# Patient Record
Sex: Female | Born: 1980 | Race: Black or African American | Hispanic: No | Marital: Single | Smoking: Former smoker
Health system: Southern US, Community
[De-identification: ages and names within clinical notes are randomized; demographics above are authoritative.]

## PROBLEM LIST (undated history)

## (undated) DIAGNOSIS — F329 Major depressive disorder, single episode, unspecified: Secondary | ICD-10-CM

## (undated) DIAGNOSIS — F32A Depression, unspecified: Secondary | ICD-10-CM

## (undated) DIAGNOSIS — I10 Essential (primary) hypertension: Secondary | ICD-10-CM

## (undated) HISTORY — DX: Essential (primary) hypertension: I10

## (undated) HISTORY — PX: APPENDECTOMY: SHX54

## (undated) HISTORY — PX: TUBAL LIGATION: SHX77

---

## 2006-07-07 ENCOUNTER — Emergency Department (HOSPITAL_COMMUNITY): Admission: EM | Admit: 2006-07-07 | Discharge: 2006-07-07 | Payer: Self-pay | Admitting: Emergency Medicine

## 2008-01-11 ENCOUNTER — Emergency Department (HOSPITAL_COMMUNITY): Admission: EM | Admit: 2008-01-11 | Discharge: 2008-01-11 | Payer: Self-pay | Admitting: Family Medicine

## 2008-05-08 ENCOUNTER — Emergency Department (HOSPITAL_COMMUNITY): Admission: EM | Admit: 2008-05-08 | Discharge: 2008-05-08 | Payer: Self-pay | Admitting: Emergency Medicine

## 2011-05-08 LAB — PREGNANCY, URINE: Preg Test, Ur: NEGATIVE

## 2014-04-24 ENCOUNTER — Encounter (HOSPITAL_COMMUNITY): Payer: Self-pay | Admitting: Emergency Medicine

## 2014-04-24 ENCOUNTER — Emergency Department (HOSPITAL_COMMUNITY)
Admission: EM | Admit: 2014-04-24 | Discharge: 2014-04-24 | Disposition: A | Discharge status: Home / Self Care | Payer: Medicaid Other | Attending: Emergency Medicine | Admitting: Emergency Medicine

## 2014-04-24 DIAGNOSIS — J029 Acute pharyngitis, unspecified: Secondary | ICD-10-CM | POA: Insufficient documentation

## 2014-04-24 DIAGNOSIS — F17200 Nicotine dependence, unspecified, uncomplicated: Secondary | ICD-10-CM | POA: Insufficient documentation

## 2014-04-24 MED ORDER — PREDNISONE 20 MG PO TABS
60.0000 mg | ORAL_TABLET | Freq: Once | ORAL | Status: AC
  Administered 2014-04-24: 60 mg via ORAL
  Filled 2014-04-24: qty 3

## 2014-04-24 MED ORDER — PENICILLIN V POTASSIUM 500 MG PO TABS: 500.0000 mg | ORAL_TABLET | Freq: Four times a day (QID) | ORAL | Status: AC

## 2014-04-24 MED ORDER — PENICILLIN V POTASSIUM 250 MG PO TABS
500.0000 mg | ORAL_TABLET | Freq: Once | ORAL | Status: AC
  Administered 2014-04-24: 500 mg via ORAL
  Filled 2014-04-24: qty 2

## 2014-04-24 MED ORDER — IBUPROFEN 800 MG PO TABS
800.0000 mg | ORAL_TABLET | Freq: Once | ORAL | Status: AC
  Administered 2014-04-24: 800 mg via ORAL
  Filled 2014-04-24: qty 1

## 2014-04-24 NOTE — ED Notes (Signed)
Pt comes to ED from home c/o generalized body aches, sore throat, ear ache, decreased appetite since Thursday. States she took theraflu, Niquel and goody powder.

## 2014-04-24 NOTE — ED Provider Notes (Signed)
CSN: [id number]     Arrival date & time 04/24/14  4098 History   First MD Initiated Contact with Patient 04/24/14 [phone]     Chief Complaint  Patient presents with  . Generalized Body Aches  . Sore Throat      Patient is a 33 y.o. female presenting with pharyngitis. The history is provided by the patient.  Sore Throat This is a new problem. The current episode started 12 to 24 hours ago. The problem occurs constantly. The problem has been gradually worsening. Associated symptoms include headaches. The symptoms are aggravated by swallowing. The symptoms are relieved by rest.  pt reports headache, ear pain, sore throat and myalgias for past 24 hours She reports only minimal cough No vomiting/diarrhea   PMH - none Past Surgical History  Procedure Laterality Date  . Tubal ligation     No family history on file. History  Substance Use Topics  . Smoking status: Current Every Day Smoker -- 2.00 packs/day    Types: Cigarettes  . Smokeless tobacco: Never Used  . Alcohol Use: Yes     Comment: daily - beer   OB History   Grav Para Term Preterm Abortions TAB SAB Ect Mult Living                 Review of Systems  Constitutional: Positive for fever and chills.  HENT: Positive for sore throat.   Respiratory: Positive for cough.   Gastrointestinal: Negative for vomiting.  Genitourinary: Negative for dysuria.  Neurological: Positive for headaches.  All other systems reviewed and are negative.     Allergies  Review of patient's allergies indicates no known allergies.  Home Medications   Prior to Admission medications   Not on File   BP 139/95  Pulse 98  Temp(Src) 100.3 F (37.9 C) (Oral)  Resp 25  Ht  (1.499 m)  Wt 109 lb (49.442 kg)  BMI 22.00 kg/m2  SpO2 99% Physical Exam CONSTITUTIONAL: Well developed/well nourished HEAD: Normocephalic/atraumatic EYES: EOMI/PERRL ENMT: Mucous membranes moist, uvula midline.  Tonsils enlarged bilaterally with exudates and  erythema noted.  No stridor.  Normal phonation Bilateral TM's obscured due to cerumen NECK: supple no meningeal signs. Cervical lymphadenopathy noted SPINE:entire spine nontender CV: S1/S2 noted, no murmurs/rubs/gallops noted LUNGS: Lungs are clear to auscultation bilaterally, no apparent distress ABDOMEN: soft, nontender, no rebound or guarding NEURO: Pt is awake/alert, moves all extremitiesx4 EXTREMITIES: pulses normal, full ROM SKIN: warm, color normal PSYCH: no abnormalities of mood noted  ED Course  Procedures   Will empirically treat for strep pharyngitis given fever/sore throat/exudates/lymphadenopathy and minimal cough  Pt is nontoxic and appropriate for outpatient management  6:36 AM Pt tolerating PO, no distress noted    MDM   Final diagnoses:  Pharyngitis    Nursing notes including past medical history and social history reviewed and considered in documentation     Joya Gaskins, MD 04/24/14 [phone]

## 2015-04-28 ENCOUNTER — Emergency Department (HOSPITAL_COMMUNITY)
Admission: EM | Admit: 2015-04-28 | Discharge: 2015-04-28 | Disposition: A | Discharge status: Home / Self Care | Payer: Medicaid Other | Attending: Emergency Medicine | Admitting: Emergency Medicine

## 2015-04-28 ENCOUNTER — Encounter (HOSPITAL_COMMUNITY): Payer: Self-pay | Admitting: *Deleted

## 2015-04-28 DIAGNOSIS — R079 Chest pain, unspecified: Secondary | ICD-10-CM | POA: Insufficient documentation

## 2015-04-28 DIAGNOSIS — Z720 Tobacco use: Secondary | ICD-10-CM | POA: Insufficient documentation

## 2015-04-28 DIAGNOSIS — H6122 Impacted cerumen, left ear: Secondary | ICD-10-CM | POA: Insufficient documentation

## 2015-04-28 DIAGNOSIS — L509 Urticaria, unspecified: Secondary | ICD-10-CM | POA: Insufficient documentation

## 2015-04-28 MED ORDER — PREDNISONE 20 MG PO TABS
60.0000 mg | ORAL_TABLET | Freq: Once | ORAL | Status: AC
  Administered 2015-04-28: 60 mg via ORAL
  Filled 2015-04-28: qty 3

## 2015-04-28 MED ORDER — FAMOTIDINE 20 MG PO TABS: 20.0000 mg | ORAL_TABLET | Freq: Two times a day (BID) | ORAL | Status: DC

## 2015-04-28 MED ORDER — DIPHENHYDRAMINE HCL 25 MG PO TABS: 25.0000 mg | ORAL_TABLET | Freq: Four times a day (QID) | ORAL | Status: DC | PRN

## 2015-04-28 NOTE — ED Notes (Signed)
Patient with allergic reaction with unknown cause. Patient reports small hives to arm and stomach intermittently. Noted swelling to right lower lip and left eye. No airway difficulty.

## 2015-04-28 NOTE — Discharge Instructions (Signed)
Hives Hives are itchy, red, swollen areas of the skin. They can vary in size and location on your body. Hives can come and go for hours or several days (acute hives) or for several weeks (chronic hives). Hives do not spread from person to person (noncontagious). They may get worse with scratching, exercise, and emotional stress. CAUSES   Allergic reaction to food, additives, or drugs.  Infections, including the common cold.  Illness, such as vasculitis, lupus, or thyroid disease.  Exposure to sunlight, heat, or cold.  Exercise.  Stress.  Contact with chemicals. SYMPTOMS   Red or white swollen patches on the skin. The patches may change size, shape, and location quickly and repeatedly.  Itching.  Swelling of the hands, feet, and face. This may occur if hives develop deeper in the skin. DIAGNOSIS  Your caregiver can usually tell what is wrong by performing a physical exam. Skin or blood tests may also be done to determine the cause of your hives. In some cases, the cause cannot be determined. TREATMENT  Mild cases usually get better with medicines such as antihistamines. Severe cases may require an emergency epinephrine injection. If the cause of your hives is known, treatment includes avoiding that trigger.  HOME CARE INSTRUCTIONS   Avoid causes that trigger your hives.  Take antihistamines as directed by your caregiver to reduce the severity of your hives. Non-sedating or low-sedating antihistamines are usually recommended. Do not drive while taking an antihistamine.  Take any other medicines prescribed for itching as directed by your caregiver.  Wear loose-fitting clothing.  Keep all follow-up appointments as directed by your caregiver. SEEK MEDICAL CARE IF:   You have persistent or severe itching that is not relieved with medicine.  You have painful or swollen joints. SEEK IMMEDIATE MEDICAL CARE IF:   You have a fever.  Your tongue or lips are swollen.  You have  trouble breathing or swallowing.  You feel tightness in the throat or chest.  You have abdominal pain. These problems may be the first sign of a life-threatening allergic reaction. Call your local emergency services (911 in [place] MAKE SURE YOU:   Understand these instructions.  Will watch your condition.  Will get help right away if you are not doing well or get worse. Document Released: 07/23/2005 Document Revised: 07/28/2013 Document Reviewed: 10/16/2011 Exit[employer] Patient Information 2015 [employer]. This information is not intended to replace advice given to you by your health care provider. Make sure you discuss any questions you have with your health care provider.  

## 2015-04-28 NOTE — ED Provider Notes (Signed)
CSN: [id number]     Arrival date & time 04/28/15  1734 History  This chart was scribed for non-physician practitioner Fayrene Helper, PA, working with Linwood Dibbles, MD, by Tanda Rockers, ED Scribe. This patient was seen in room TR07C/TR07C and the patient's care was started at 5:57 PM.  Chief Complaint  Patient presents with  . Allergic Reaction   The history is provided by the patient. No language interpreter was used.     HPI Comments: Heidi Molina is a 34 y.o. female who presents to the Emergency Department complaining of gradual onset, intermittent, diffuse itching and hives x 2-3 days. She notes intermittent lower lip swelling x 2 weeks and left eye swelling x 2 days. Pt also complains of chest pain but attributes it to her reflux. She mentions recently changing her soap. 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 She mentions that the first time she went to the patient's house she felt itchy. Pt has not taken any OTC allergy medication. Denies fever, chills, abdominal cramping, nausea, vomiting,  shortness of breath, difficulty breathing, or any other associated symptoms.    History reviewed. No pertinent past medical history. Past Surgical History  Procedure Laterality Date  . Tubal ligation    . Appendectomy     History reviewed. No pertinent family history. Social History  Substance Use Topics  . Smoking status: Current Every Day Smoker -- 2.00 packs/day    Types: Cigarettes  . Smokeless tobacco: Never Used  . Alcohol Use: Yes     Comment: daily - beer   OB History    No data available     Review of Systems  Constitutional: Negative for fever and chills.  HENT: Positive for facial swelling (Lower lip swelling. Left eye swelling. ). Negative for trouble swallowing.   Respiratory: Negative for shortness of breath.   Cardiovascular: Positive for chest pain.  Gastrointestinal: Negative for nausea, vomiting and abdominal pain.  Skin:        Hives   Allergies  Review of patient's allergies indicates no known allergies.  Home Medications   Prior to Admission medications   Not on File   Triage Vitals: BP 149/91 mmHg  Pulse 74  Temp(Src) 98 F (36.7 C)  Resp 16  SpO2 100%   Physical Exam  Constitutional: She is oriented to person, place, and time. She appears well-developed and well-nourished. No distress.  HENT:  Head: Normocephalic and atraumatic.  Cerumen impact in left ear Right TM is normal  Eyes: Conjunctivae and EOM are normal.  Neck: Neck supple. No tracheal deviation present.  Cardiovascular: Normal rate.   Pulmonary/Chest: Effort normal and breath sounds normal. No respiratory distress. She has no decreased breath sounds. She has no wheezes. She has no rhonchi. She has no rales.  Normal phonation  Musculoskeletal: Normal range of motion.  Neurological: She is alert and oriented to person, place, and time.  Skin: Skin is warm and dry. No rash noted.  hives noted to left upper eyelid hives noted to the right lower lip   Psychiatric: She has a normal mood and affect. Her behavior is normal.  Nursing note and vitals reviewed.   ED Course  Procedures (including critical care time)  DIAGNOSTIC STUDIES: Oxygen Saturation is 100% on RA, normal by my interpretation.    COORDINATION OF CARE: 6:05 PM-suspect allergy to something at her clients house.  No anaphylactic reaction noted.  Pt resting comfortably.  Discussed  treatment plan which includes Rx Pepcid and Benadryl with pt at bedside and pt agreed to plan. Stomach ache is likely 2/2 heart burn and less likely anaphylaxis.  Return precaution discussed  MDM   Final diagnoses:  Hives   BP 149/91 mmHg  Pulse 74  Temp(Src) 98 F (36.7 C)  Resp 16  SpO2 100%   I personally performed the services described in this documentation, which was scribed in my presence. The recorded information has been reviewed and is accurate.       Fayrene Helper,  PA-C 04/28/15 1808  Linwood Dibbles, MD 04/29/15 [phone]

## 2015-09-06 ENCOUNTER — Encounter (HOSPITAL_COMMUNITY): Payer: Self-pay | Admitting: Cardiology

## 2015-09-06 ENCOUNTER — Emergency Department (HOSPITAL_COMMUNITY)
Admission: EM | Admit: 2015-09-06 | Discharge: 2015-09-06 | Disposition: A | Discharge status: Home / Self Care | Payer: Medicaid Other | Attending: Emergency Medicine | Admitting: Emergency Medicine

## 2015-09-06 DIAGNOSIS — R51 Headache: Secondary | ICD-10-CM | POA: Insufficient documentation

## 2015-09-06 DIAGNOSIS — I10 Essential (primary) hypertension: Secondary | ICD-10-CM | POA: Insufficient documentation

## 2015-09-06 DIAGNOSIS — F17210 Nicotine dependence, cigarettes, uncomplicated: Secondary | ICD-10-CM | POA: Insufficient documentation

## 2015-09-06 NOTE — ED Notes (Signed)
Pt reports a headache and her BP has been elevated over the past 2 days. States she had it checked at work and it was 170s

## 2015-09-06 NOTE — ED Notes (Signed)
Patient called again in main ED waiting area with no response 

## 2015-09-06 NOTE — ED Notes (Signed)
Pt called by staff to be roomed - no response, unable to locate patient.

## 2015-09-06 NOTE — ED Notes (Signed)
Called patient without response - unable to locate patient.

## 2015-09-06 NOTE — ED Notes (Signed)
Patient called in main ED waiting area with no response 

## 2016-03-11 ENCOUNTER — Encounter (HOSPITAL_COMMUNITY): Payer: Self-pay | Admitting: Emergency Medicine

## 2016-03-11 ENCOUNTER — Emergency Department (HOSPITAL_COMMUNITY)
Admission: EM | Admit: 2016-03-11 | Discharge: 2016-03-11 | Disposition: A | Discharge status: Home / Self Care | Payer: Medicaid Other | Attending: Emergency Medicine | Admitting: Emergency Medicine

## 2016-03-11 DIAGNOSIS — Y939 Activity, unspecified: Secondary | ICD-10-CM | POA: Insufficient documentation

## 2016-03-11 DIAGNOSIS — Y929 Unspecified place or not applicable: Secondary | ICD-10-CM | POA: Insufficient documentation

## 2016-03-11 DIAGNOSIS — M7918 Myalgia, other site: Secondary | ICD-10-CM

## 2016-03-11 DIAGNOSIS — M542 Cervicalgia: Secondary | ICD-10-CM | POA: Insufficient documentation

## 2016-03-11 DIAGNOSIS — F17210 Nicotine dependence, cigarettes, uncomplicated: Secondary | ICD-10-CM | POA: Insufficient documentation

## 2016-03-11 DIAGNOSIS — Y990 Civilian activity done for income or pay: Secondary | ICD-10-CM | POA: Insufficient documentation

## 2016-03-11 DIAGNOSIS — X500XXA Overexertion from strenuous movement or load, initial encounter: Secondary | ICD-10-CM | POA: Insufficient documentation

## 2016-03-11 MED ORDER — NAPROXEN 250 MG PO TABS
500.0000 mg | ORAL_TABLET | Freq: Once | ORAL | Status: AC
  Administered 2016-03-11: 500 mg via ORAL
  Filled 2016-03-11: qty 2

## 2016-03-11 MED ORDER — NAPROXEN 500 MG PO TABS: 500.0000 mg | ORAL_TABLET | Freq: Two times a day (BID) | ORAL | 0 refills | Status: DC

## 2016-03-11 NOTE — ED Provider Notes (Signed)
[facility]-EMERGENCY DEPT Provider Note   CSN: [id number] Arrival date & time: 03/11/16  1925  First Provider Contact: First MD Initiated Contact with Patient 03/11/16 1934      By signing my name below, I, Octavia Heirrianna Nassar, attest that this documentation has been prepared under the direction and in the presence of Cheri FowlerKayla Nicholas Trompeter, PA-C.  Electronically Signed: Octavia HeirArianna Nassar, ED Scribe. 03/11/16. 7:45 PM.    History   Chief Complaint Chief Complaint  Patient presents with  . Neck Pain    The history is provided by the patient. No language interpreter was used.   HPI Comments: Jacquenette Shoneiffany S Azizi is a 35 y.o. female who presents to the Emergency Department complaining of sudden onset, gradual worsening, moderate sharp, right sided neck pain that radiates to to her right elbow onset 5 days ago. She reports associated tingling to the area and numbness to her right shoulder. 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 Pt says applying heat alleviates her pain while sleeping and certain movements increases her pain. Pt denies chest pain, shortness of breath, headache, dizziness, visual disturbance, gait abnormalities, fever, chills.  History reviewed. No pertinent past medical history.  There are no active problems to display for this patient.   Past Surgical History:  Procedure Laterality Date  . APPENDECTOMY    . TUBAL LIGATION      OB History    No data available       Home Medications    Prior to Admission medications   Medication Sig Start Date End Date Taking? Authorizing Provider  diphenhydrAMINE (BENADRYL) 25 MG tablet Take 1 tablet (25 mg total) by mouth every 6 (six) hours as needed for itching or allergies. 04/28/15   Fayrene HelperBowie Tran, PA-C  famotidine (PEPCID) 20 MG tablet Take 1 tablet (20 mg total) by mouth 2 (two) times daily. 04/28/15   Fayrene HelperBowie Tran, PA-C  naproxen (NAPROSYN) 500 MG tablet Take 1 tablet (500 mg total) by mouth 2 (two) times daily. 03/11/16   Cheri FowlerKayla Menachem Urbanek, PA-C     Family History History reviewed. No pertinent family history.  Social History Social History  Substance Use Topics  . Smoking status: Current Every Day Smoker    Packs/day: 1.00    Types: Cigarettes  . Smokeless tobacco: Never Used  . Alcohol use Yes     Comment: daily - beer     Allergies   Review of patient's allergies indicates no known allergies.   Review of Systems Review of Systems  Respiratory: Negative for shortness of breath.   Cardiovascular: Negative for chest pain.  Neurological: Negative for dizziness and headaches.     Physical Exam Updated Vital Signs BP (!) 191/103 (BP Location: Left Arm)   Pulse 65   Temp 98.1 F (36.7 C) (Oral)   Resp 20   LMP 01/19/2016   SpO2 100%   Physical Exam  Constitutional: She is oriented to person, place, and time. She appears well-developed and well-nourished.  HENT:  Head: Normocephalic and atraumatic.  Right Ear: External ear normal.  Left Ear: External ear normal.  Eyes: Conjunctivae are normal. No scleral icterus.  Neck: No tracheal deviation present.  No cervical midline tenderness, full active ROM in lateral flexion and anterior flexion Mild left trapezius tenderness Left shoulder is non-tender  Cardiovascular: Normal rate, regular rhythm and normal heart sounds.   Pulses:      Radial pulses are 2+ on the right side, and 2+ on the left side.  Pulmonary/Chest: Effort normal and breath sounds normal. No respiratory distress. She has no wheezes. She has no rales.  Abdominal: She exhibits no distension.  Musculoskeletal: Normal range of motion. She exhibits tenderness.  Neurological: She is alert and oriented to person, place, and time.  Normal strength and sensation in bilateral upper extremities  Skin: Skin is warm and dry.  Psychiatric: She has a normal mood and affect. Her behavior is normal.     ED Treatments / Results  DIAGNOSTIC STUDIES: Oxygen Saturation is 100% on RA, normal by my  interpretation.  COORDINATION OF CARE:  7:41 PM Discussed treatment plan which includes naproxen and applying heat with pt at bedside and pt agreed to plan.  Labs (all labs ordered are listed, but only abnormal results are displayed) Labs Reviewed - No data to display  EKG  EKG Interpretation None       Radiology No results found.  Procedures Procedures (including critical care time)  Medications Ordered in ED Medications  naproxen (NAPROSYN) tablet 500 mg (not administered)     Initial Impression / Assessment and Plan / ED Course  I have reviewed the triage vital signs and the nursing notes.  Pertinent labs & imaging results that were available during my care of the patient were reviewed by me and considered in my medical decision making (see chart for details).  Clinical Course   Patient presents with musculoskeletal strain/neck pain.  VSS, NAD.  No bony tenderness.  No cervical midline tenderness.  Good pulses.  Neurovascularly intact.  FAROM of cervical spine.  No neurologic symptoms, low suspicion for carotid dissection.  No signs of infection.  Recommend continued heat and will add Naproxen.  Return precautions discussed.  Patient agrees and acknowledges the above plan for discharge.   Final Clinical Impressions(s) / ED Diagnoses   Final diagnoses:  Neck pain  Musculoskeletal pain    I personally performed the services described in this documentation, which was scribed in my presence. The recorded information has been reviewed and is accurate.  New Prescriptions New Prescriptions   NAPROXEN (NAPROSYN) 500 MG TABLET    Take 1 tablet (500 mg total) by mouth 2 (two) times daily.     Cheri Fowler, PA-C 03/11/16 1948    Geoffery Lyons, MD 03/12/16 [phone]

## 2016-03-11 NOTE — ED Triage Notes (Signed)
Pt presents to ED for assessment of neck pain that began 5 days ago.  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  Pt sts she was sitting on the front porch on her day off and began to experience pain.  Pt sts some numbness to her right shoulder.  Denies difficulty walking, denies blurred vision.

## 2016-06-15 ENCOUNTER — Emergency Department (HOSPITAL_COMMUNITY)
Admission: EM | Admit: 2016-06-15 | Discharge: 2016-06-16 | Disposition: A | Discharge status: Home / Self Care | Payer: Medicaid Other | Attending: Emergency Medicine | Admitting: Emergency Medicine

## 2016-06-15 ENCOUNTER — Encounter (HOSPITAL_COMMUNITY): Payer: Self-pay | Admitting: Emergency Medicine

## 2016-06-15 DIAGNOSIS — F17210 Nicotine dependence, cigarettes, uncomplicated: Secondary | ICD-10-CM | POA: Insufficient documentation

## 2016-06-15 DIAGNOSIS — R21 Rash and other nonspecific skin eruption: Secondary | ICD-10-CM

## 2016-06-15 NOTE — ED Triage Notes (Signed)
Pt c/o ringworm on back, face "for a while," c/o itching. Denies pain. Tried OTC lotions and soaps with no relief. 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

## 2016-06-16 MED ORDER — TERBINAFINE HCL 1 % EX CREA
1.0000 | TOPICAL_CREAM | Freq: Two times a day (BID) | CUTANEOUS | 0 refills | Status: DC
Start: 2016-06-16 — End: 2017-01-28

## 2016-06-16 NOTE — ED Provider Notes (Signed)
[facility]-EM[facility]PT Provider Note   CSN: [id number] Arrival date & time: 06/15/16  2327     History   Chief Complaint Chief Complaint  Patient presents with  . Rash    HPI Heidi Molina is a 35 y.o. female with no major medial problems presents to the Emergency Department complaining of gradual, persistent, progressively worsening itchy rash onset 3 weeks ago. Patient states the rash began with one single patch to her left posterior leg and has since had multiple patches arise over her entire body. She reports they are annular with raised edges. She states significant itching.  She has been using iodine and apple cider vinegar with some decreasing itching, but no resolution.  Pt with associated scaling of the rash. 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 She states she is concerned that this is ringworm and that it came from this particular house.  Pt denies fever, chills, headache, neck pain, chest pain, SOB, abd pain, N/V/D, weakness, dizziness, syncope.      The history is provided by the patient and medical records. No language interpreter was used.    History reviewed. No pertinent past medical history.  There are no active problems to display for this patient.   Past Surgical History:  Procedure Laterality Date  . APPENDECTOMY    . TUBAL LIGATION      OB History    No data available       Home Medications    Prior to Admission medications   Medication Sig Start Date End Date Taking? Authorizing Provider  diphenhydrAMINE (BENADRYL) 25 MG tablet Take 1 tablet (25 mg total) by mouth every 6 (six) hours as needed for itching or allergies. 04/28/15   Fayrene HelperBowie Tran, PA-C  famotidine (PEPCID) 20 MG tablet Take 1 tablet (20 mg total) by mouth 2 (two) times daily. 04/28/15   Fayrene HelperBowie Tran, PA-C  naproxen (NAPROSYN) 500 MG tablet Take 1 tablet  (500 mg total) by mouth 2 (two) times daily. 03/11/16   Cheri FowlerKayla Rose, PA-C  terbinafine (LAMISIL) 1 % cream Apply 1 application topically 2 (two) times daily. 06/16/16   Montrey Buist, PA-C    Family History No family history on file.  Social History Social History  Substance Use Topics  . Smoking status: Current Every Day Smoker    Packs/day: 1.00    Types: Cigarettes  . Smokeless tobacco: Never Used  . Alcohol use Yes     Comment: daily - beer     Allergies   Patient has no known allergies.   Review of Systems Review of Systems  Skin: Positive for rash.  All other systems reviewed and are negative.    Physical Exam Updated Vital Signs BP 162/95   Pulse 84   Temp 98.8 F (37.1 C) (Oral)   Resp 16   Ht 5' (1.524 m)   Wt 58.1 kg   LMP 05/20/2016 (Within Days)   SpO2 98%   BMI 25.00 kg/m   Physical Exam  Constitutional: She is oriented to person, place, and time. She appears well-developed and well-nourished. No distress.  HENT:  Head: Normocephalic and atraumatic.  Right Ear: Tympanic membrane, external ear and ear canal normal.  Left Ear: Tympanic membrane, external ear and ear canal normal.  Nose: Nose normal. No mucosal edema or rhinorrhea.  Mouth/Throat: Uvula is midline. No uvula swelling. No oropharyngeal  exudate, posterior oropharyngeal edema, posterior oropharyngeal erythema or tonsillar abscesses.  No swelling of the uvula or oropharynx  Eyes: Conjunctivae are normal.  Neck: Normal range of motion.  Patent airway No stridor; normal phonation Handling secretions without difficulty  Cardiovascular: Normal rate, normal heart sounds and intact distal pulses.   No murmur heard. Pulmonary/Chest: Effort normal and breath sounds normal. No stridor. No respiratory distress. She has no wheezes.  No wheezes or rhonchi  Abdominal: Soft. Bowel sounds are normal. There is no tenderness.  Musculoskeletal: Normal range of motion. She exhibits no edema.    Neurological: She is alert and oriented to person, place, and time.  Skin: Skin is warm and dry. Rash noted. She is not diaphoretic.  No urticaria noted Mild excoriations - no induration or fluctuance to indicate secondary infection Multiple annular erythematous and scaling lesions on the legs, arms, trunk and face.  No evidence of secondary infection.  Psychiatric: She has a normal mood and affect.  Nursing note and vitals reviewed.    ED Treatments / Results   Procedures Procedures (including critical care time)  Medications Ordered in ED Medications - No data to display   Initial Impression / Assessment and Plan / ED Course  I have reviewed the triage vital signs and the nursing notes.  Pertinent labs & imaging results that were available during my care of the patient were reviewed by me and considered in my medical decision making (see chart for details).  Clinical Course     She presents with 3 weeks of a rash. Rash is clinically most consistent with pityriasis rosea however herald patch did not begin on the trunk.  Patient has been in contact with several animals including dogs. She reports she wears long sleeves and long pain at while in contact with these animals.  Tinea corporis versus pityriasis rosea.  Will give antifungal and have patient follow-up with primary care. No evidence of secondary infection. Discussed home care of the rash. Patient reports understanding and is in agreement with the plan for follow-up.   Final Clinical Impressions(s) / ED Diagnoses   Final diagnoses:  Rash and nonspecific skin eruption    New Prescriptions Discharge Medication List as of 06/16/2016  1:20 AM    START taking these medications   Details  terbinafine (LAMISIL) 1 % cream Apply 1 application topically 2 (two) times daily., Starting Sat 06/16/2016, [employer]         Joseph Johns, PA-C 06/16/16 [id number]    Loren Raceravid Yelverton, MD 06/16/16 [phone]

## 2016-06-16 NOTE — Discharge Instructions (Signed)
1. Medications: Use cream as directed, usual home medications 2. Treatment: rest, drink plenty of fluids, keep area clean with warm soap and water 3. Follow Up: Please followup with your primary doctor in 2-3 days and dermatology as soon as possible for discussion of your diagnoses and further evaluation after today's visit; if you do not have a primary care doctor use the resource guide provided to find one; Please return to the ER for signs of allergic reaction

## 2016-06-18 ENCOUNTER — Encounter (HOSPITAL_COMMUNITY): Payer: Self-pay | Admitting: Emergency Medicine

## 2016-06-18 ENCOUNTER — Ambulatory Visit (HOSPITAL_COMMUNITY)
Admission: EM | Admit: 2016-06-18 | Discharge: 2016-06-18 | Disposition: A | Discharge status: Home / Self Care | Payer: Medicaid Other | Attending: Emergency Medicine | Admitting: Emergency Medicine

## 2016-06-18 DIAGNOSIS — R21 Rash and other nonspecific skin eruption: Secondary | ICD-10-CM

## 2016-06-18 MED ORDER — MUPIROCIN 2 % EX OINT: 1.0000 "application " | TOPICAL_OINTMENT | Freq: Two times a day (BID) | CUTANEOUS | 0 refills | Status: DC

## 2016-06-18 NOTE — Discharge Instructions (Signed)
I'm not sure what the rash is. I put a referral in to dermatology. Stop the clotrimazole cream. Use the triamcinolone and mupirocin twice a day. Follow-up with dermatology.

## 2016-06-18 NOTE — ED Triage Notes (Signed)
Seen at ed on Friday.  Patient has areas that she thinks is possibly ring worm on legs.   Patient says ed said pityriasis.  Patient is unsure.  Patient has rash to face and trunk, arms, buttocks and legs.

## 2016-06-18 NOTE — ED Provider Notes (Signed)
[facility]    CSN: [id number] Arrival date & time: 06/18/16  1520     History   Chief Complaint Chief Complaint  Patient presents with  . Rash    HPI Heidi Molina is a 35 y.o. female.   HPI Patient is a 35 y.o. female presents to UC with gradually worsening itchy rash which began about 3 weeks ago.  States that it began with one spot on the back of left leg and has been having new lesions appearing slowly all over the body including arms, buttocks, legs, abdomen, back, face, and neck.  States that the lesions tend to scale over and peel. Pt went to the ED on 11/10 and it was thought that the lesions were tinea corporis versus pityriasis rosea. Pt was given Terbinafine 1% which she has not yet filled. Pt has been using Triamcinolone, Flucinonide, and clotrimazole which are a combination of her fiance and son's prescriptions that she had at home. She has had no relief with these.  Has also been using apple cider vinegar and iodine which she thinks has helped to clear some of the lesions. Denies anything like this in the past, any new lotions or detergents.  No bug bites that she knows of. Denies hx of eczema although son has eczema. Denies any fever, chills, abdominal pain, N/V/D.     History reviewed. No pertinent past medical history.  There are no active problems to display for this patient.   Past Surgical History:  Procedure Laterality Date  . APPENDECTOMY    . TUBAL LIGATION      OB History    No data available       Home Medications    Prior to Admission medications   Medication Sig Start Date End Date Taking? Authorizing Provider  mupirocin ointment (BACTROBAN) 2 % Apply 1 application topically 2 (two) times daily. 06/18/16   Charm RingsErin J Lesette Frary, MD  terbinafine (LAMISIL) 1 % cream Apply 1 application topically 2 (two) times daily. Patient taking differently: Apply 1 application topically 2 (two) times daily. DID NOT GET FILLED 06/16/16   Dahlia ClientHannah Muthersbaugh,  PA-C    Family History No family history on file.  Social History Social History  Substance Use Topics  . Smoking status: Current Every Day Smoker    Packs/day: 1.00    Types: Cigarettes  . Smokeless tobacco: Never Used  . Alcohol use Yes     Comment: daily - beer     Allergies   Patient has no known allergies.   Review of Systems Review of Systems  Constitutional: Negative for activity change, chills, fatigue and fever.  Respiratory: Negative for chest tightness and shortness of breath.   Cardiovascular: Negative for chest pain.  Gastrointestinal: Negative for abdominal pain, blood in stool, diarrhea, nausea and vomiting.  Genitourinary: Negative for dysuria.  Musculoskeletal: Negative for arthralgias.     Physical Exam Triage Vital Signs ED Triage Vitals [06/18/16 1651]  Enc Vitals Group     BP 138/93     Pulse Rate 71     Resp 16     Temp 98.1 F (36.7 C)     Temp Source Oral     SpO2 100 %     Weight      Height      Head Circumference      Peak Flow      Pain Score 1     Pain Loc      Pain Edu?  Excl. in GC?    No data found.   Updated Vital Signs BP 138/93 (BP Location: Left Arm)   Pulse 71   Temp 98.1 F (36.7 C) (Oral)   Resp 16   LMP 05/20/2016 (Within Days)   SpO2 100%    Physical Exam  Constitutional: She is oriented to person, place, and time. She appears well-developed and well-nourished. No distress.  HENT:  Head: Normocephalic and atraumatic.  Mouth/Throat: Uvula is midline and oropharynx is clear and moist. No uvula swelling.  Eyes: EOM are normal.  Neck: Normal range of motion and phonation normal.  Cardiovascular: Normal rate and regular rhythm.   Pulmonary/Chest: Effort normal and breath sounds normal. No stridor. No respiratory distress. She has no decreased breath sounds. She has no wheezes.  Musculoskeletal: Normal range of motion.  Neurological: She is alert and oriented to person, place, and time.  Skin: Skin is  warm and dry. Rash noted. Rash is not pustular, not vesicular and not urticarial. She is not diaphoretic.  Multiple annular erythematous and scaling lesions on the legs, arms, trunk, neck, and face. They appear to be in different phases of healing. Largest lesion is about 3 cm side to side x 2 cm up and down and is the one on the posterior left thigh which pt states was the first to present 3 weeks ago. Lesions are not grouped together, no pattern to the locations. No evidence of secondary infection. No urticaria present.   Psychiatric: She has a normal mood and affect. Her behavior is normal. Judgment and thought content normal.  Nursing note and vitals reviewed.    UC Treatments / Results  Labs (all labs ordered are listed, but only abnormal results are displayed) Labs Reviewed - No data to display  EKG  EKG Interpretation None       Radiology No results found.  Procedures Procedures (including critical care time)  Medications Ordered in UC Medications - No data to display   Initial Impression / Assessment and Plan / UC Course  I have reviewed the triage vital signs and the nursing notes.  Pertinent labs & imaging results that were available during my care of the patient were reviewed by me and considered in my medical decision making (see chart for details).  Clinical Course    Patient is a 35 y.o. female presents with annular erythematous and scaling rash present on torso, buttocks, legs, arms, face, neck which began with harold patch on posterior left thigh 3 weeks ago.  Not sure what the rash is  Mupirocin ointment 2% bid prescribed at today's visit.    Patient has been referred to dermatology.  Advised to stop the Clotrimazole cream and to use the Triamcinolone and Mupirocin twice daily.  Encouraged to follow up with dermatology.   Patient seen and examined with PA student.  Note reviewed and edited as needed.  Final Clinical Impressions(s) / UC Diagnoses    Final diagnoses:  Rash    New Prescriptions Discharge Medication List as of 06/18/2016  5:49 PM    START taking these medications   Details  mupirocin ointment (BACTROBAN) 2 % Apply 1 application topically 2 (two) times daily., Starting Mon 06/18/2016, Normal         Charm RingsErin J Chriss Redel, MD 06/18/16 2126

## 2017-01-25 ENCOUNTER — Emergency Department (HOSPITAL_COMMUNITY)
Admission: EM | Admit: 2017-01-25 | Discharge: 2017-01-25 | Disposition: A | Discharge status: Other Acute Inpatient Hospital | Payer: Medicaid Other | Attending: Emergency Medicine | Admitting: Emergency Medicine

## 2017-01-25 ENCOUNTER — Encounter (HOSPITAL_COMMUNITY): Payer: Self-pay | Admitting: *Deleted

## 2017-01-25 ENCOUNTER — Inpatient Hospital Stay (HOSPITAL_COMMUNITY)
Admission: AD | Admit: 2017-01-25 | Discharge: 2017-01-28 | DRG: 885 | Disposition: A | Discharge status: Home / Self Care | Payer: Medicaid Other | Source: Intra-hospital | Attending: Psychiatry | Admitting: Psychiatry

## 2017-01-25 DIAGNOSIS — F322 Major depressive disorder, single episode, severe without psychotic features: Secondary | ICD-10-CM | POA: Diagnosis present

## 2017-01-25 DIAGNOSIS — R45851 Suicidal ideations: Secondary | ICD-10-CM | POA: Diagnosis present

## 2017-01-25 DIAGNOSIS — F1990 Other psychoactive substance use, unspecified, uncomplicated: Secondary | ICD-10-CM

## 2017-01-25 DIAGNOSIS — F17210 Nicotine dependence, cigarettes, uncomplicated: Secondary | ICD-10-CM | POA: Diagnosis present

## 2017-01-25 DIAGNOSIS — F321 Major depressive disorder, single episode, moderate: Secondary | ICD-10-CM | POA: Diagnosis present

## 2017-01-25 DIAGNOSIS — F329 Major depressive disorder, single episode, unspecified: Secondary | ICD-10-CM | POA: Insufficient documentation

## 2017-01-25 DIAGNOSIS — F32A Depression, unspecified: Secondary | ICD-10-CM

## 2017-01-25 HISTORY — DX: Depression, unspecified: F32.A

## 2017-01-25 HISTORY — DX: Major depressive disorder, single episode, unspecified: F32.9

## 2017-01-25 HISTORY — DX: Major depressive disorder, single episode, moderate: F32.1

## 2017-01-25 LAB — COMPREHENSIVE METABOLIC PANEL
ALBUMIN: 4.5 g/dL (ref 3.5–5.0)
ALK PHOS: 64 U/L (ref 38–126)
ALT: 17 U/L (ref 14–54)
ANION GAP: 10 (ref 5–15)
AST: 18 U/L (ref 15–41)
BILIRUBIN TOTAL: 0.3 mg/dL (ref 0.3–1.2)
BUN: 6 mg/dL (ref 6–20)
CALCIUM: 9.4 mg/dL (ref 8.9–10.3)
CO2: 22 mmol/L (ref 22–32)
Chloride: 107 mmol/L (ref 101–111)
Creatinine, Ser: 0.7 mg/dL (ref 0.44–1.00)
GFR calc non Af Amer: >60 mL/min (ref >60)
GLUCOSE: 89 mg/dL (ref 65–99)
POTASSIUM: 3.9 mmol/L (ref 3.5–5.1)
SODIUM: 139 mmol/L (ref 135–145)
TOTAL PROTEIN: 8 g/dL (ref 6.5–8.1)

## 2017-01-25 LAB — CBC
HCT: 40.9 % (ref 36.0–46.0)
Hemoglobin: 13.7 g/dL (ref 12.0–15.0)
MCH: 30 pg (ref 26.0–34.0)
MCHC: 33.5 g/dL (ref 30.0–36.0)
MCV: 89.7 fL (ref 78.0–100.0)
Platelets: 435 10*3/uL — ABNORMAL HIGH (ref 150–400)
RBC: 4.56 MIL/uL (ref 3.87–5.11)
RDW: 12.8 % (ref 11.5–15.5)
WBC: 6.3 10*3/uL (ref 4.0–10.5)

## 2017-01-25 LAB — ETHANOL: Alcohol, Ethyl (B): 126 mg/dL — ABNORMAL HIGH (ref <5)

## 2017-01-25 LAB — SALICYLATE LEVEL

## 2017-01-25 LAB — RAPID URINE DRUG SCREEN, HOSP PERFORMED
Amphetamines: NOT DETECTED
BENZODIAZEPINES: NOT DETECTED
Barbiturates: NOT DETECTED
COCAINE: NOT DETECTED
OPIATES: NOT DETECTED
Tetrahydrocannabinol: POSITIVE — AB

## 2017-01-25 LAB — ACETAMINOPHEN LEVEL

## 2017-01-25 LAB — I-STAT BETA HCG BLOOD, ED (MC, WL, AP ONLY): I-stat hCG, quantitative: <5 m[IU]/mL (ref <5)

## 2017-01-25 MED ORDER — ALUM & MAG HYDROXIDE-SIMETH 200-200-20 MG/5ML PO SUSP: 30.0000 mL | ORAL | Status: DC | PRN

## 2017-01-25 MED ORDER — TRAZODONE HCL 50 MG PO TABS
50.0000 mg | ORAL_TABLET | Freq: Every evening | ORAL | Status: DC | PRN
  Administered 2017-01-27: 50 mg via ORAL
  Filled 2017-01-25 (×2): qty 7
  Filled 2017-01-25: qty 1

## 2017-01-25 MED ORDER — ACETAMINOPHEN 325 MG PO TABS
650.0000 mg | ORAL_TABLET | Freq: Four times a day (QID) | ORAL | Status: DC | PRN
  Administered 2017-01-25 – 2017-01-27 (×2): 650 mg via ORAL
  Filled 2017-01-25 (×2): qty 2

## 2017-01-25 MED ORDER — HYDROXYZINE HCL 25 MG PO TABS
25.0000 mg | ORAL_TABLET | Freq: Three times a day (TID) | ORAL | Status: DC | PRN
  Filled 2017-01-25: qty 10

## 2017-01-25 MED ORDER — MAGNESIUM HYDROXIDE 400 MG/5ML PO SUSP: 30.0000 mL | Freq: Every day | ORAL | Status: DC | PRN

## 2017-01-25 MED ORDER — IBUPROFEN 400 MG PO TABS
400.0000 mg | ORAL_TABLET | Freq: Once | ORAL | Status: AC
  Administered 2017-01-25: 400 mg via ORAL
  Filled 2017-01-25: qty 1

## 2017-01-25 NOTE — ED Triage Notes (Signed)
Pt states she feels like killing herself.  No specific plan.  Pt tearful in triage.  Has not been taking meds because she can't afford them.

## 2017-01-25 NOTE — ED Notes (Signed)
Regular Diet ordered for patient for dinner.

## 2017-01-25 NOTE — BH Assessment (Signed)
Tele Assessment Note   Heidi Molina is an 36 y.o. female presenting to the ED after an attempt to stab her wrist and jump out of a moving car. The patient reports worsening depression over the last several months. The patient has a history of depression and suicide attempt. States she has multiped stressors including her job, family conflict, and continued grief over the loss of her mother 4 yrs ago.  The patient reports increased drinking over the last month and daily cannabis use for years. Admits to mood lability, tearful at times and at other times angry. The patient denies HI or A/V.   The patient lives with her boyfriend, Doreene Adas, her two children and a nephew. 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 Patient had depressed mood and affect, freedom of movement. Reports severe anxiety, had coherent thought, impaired judgment and insight.   Hillery Jacks DNP recommends inpatient treatment  Diagnosis: MDD, recurrent severe, without psychotic features  Past Medical History:  Past Medical History:  Diagnosis Date  . Depression     Past Surgical History:  Procedure Laterality Date  . APPENDECTOMY    . TUBAL LIGATION      Family History: No family history on file.  Social History:  reports that she has been smoking Cigarettes.  She has been smoking about 1.00 pack per day. She has never used smokeless tobacco. She reports that she drinks alcohol. She reports that she uses drugs, including Marijuana.  Additional Social History:  Alcohol / Drug Use Pain Medications: see MAR Prescriptions: see MAR Over the Counter: see MAR History of alcohol / drug use?: Yes Substance #1 Name of Substance 1: alcohol 1 - Age of First Use: uknown  1 - Amount (size/oz): coors light, 3-6 beers 1 - Frequency: daily 1 - Duration: months 1 - Last Use / Amount: today Substance #2 Name of Substance 2: cannabis 2 - Age of First Use: 15 2 - Amount (size/oz): 3 blunts 2 - Frequency: daily 2 - Duration:  years 2 - Last Use / Amount: today  CIWA: CIWA-Ar BP: (!) 144/100 Pulse Rate: 88 COWS:    PATIENT STRENGTHS: (choose at least two)   Allergies: No Known Allergies  Home Medications:  (Not in a hospital admission)  OB/GYN Status:  Patient's last menstrual period was 12/04/2016.  General Assessment Data Location of Assessment: [facility] ED TTS Assessment: In system Is this a Tele or Face-to-Face Assessment?: Tele Assessment Is this an Initial Assessment or a Re-assessment for this encounter?: Initial Assessment Marital status: Long term relationship Is patient pregnant?: No Pregnancy Status: No Living Arrangements: Spouse/significant other, Children, Other relatives Can pt return to current living arrangement?: Yes Admission Status: Voluntary Is patient capable of signing voluntary admission?: Yes Referral Source: Self/Family/Friend Insurance type: MCD  Medical Screening Exam [facility] Walk-in ONLY) Medical Exam completed: Yes  Crisis Care Plan Living Arrangements: Spouse/significant other, Children, Other relatives Name of Psychiatrist: n/a Name of Therapist: n/a  Education Status Is patient currently in school?: Yes Highest grade of school patient has completed: GED, Some college   Risk to self with the past 6 months Suicidal Ideation: Yes-Currently Present Has patient been a risk to self within the past 6 months prior to admission? : Yes Suicidal Intent: Yes-Currently Present Has patient had any suicidal intent within the past 6 months prior to admission? : Yes Is patient at risk for suicide?: Yes Suicidal Plan?: Yes-Currently Present Has patient had any suicidal plan within the past 6 months prior  to admission? : Yes Specify Current Suicidal Plan:  (jump out of car, cut wrist) Access to Means: Yes Specify Access to Suicidal Means: acces to sharp objects, access to a car What has been your use of drugs/alcohol within the last 12 months?: cannabis, alcohol Previous  Attempts/Gestures: Yes How many times?:  (multiple) Triggers for Past Attempts: Unknown Intentional Self Injurious Behavior: None Family Suicide History: Unknown Recent stressful life event(s): Conflict (Comment), Loss (Comment), Financial Problems Persecutory voices/beliefs?: No Depression: Yes Depression Symptoms: Tearfulness, Feeling angry/irritable Substance abuse history and/or treatment for substance abuse?: Yes Suicide prevention information given to non-admitted patients: Not applicable  Risk to Others within the past 6 months Homicidal Ideation: No Does patient have any lifetime risk of violence toward others beyond the six months prior to admission? : No Thoughts of Harm to Others: No Current Homicidal Intent: No Current Homicidal Plan: No Access to Homicidal Means: No History of harm to others?: No Assessment of Violence: None Noted Does patient have access to weapons?: No Criminal Charges Pending?: No Does patient have a court date: No Is patient on probation?: No  Psychosis Hallucinations: None noted Delusions: None noted  Mental Status Report Appearance/Hygiene: Unremarkable Eye Contact: Good Motor Activity: Freedom of movement Speech: Logical/coherent Level of Consciousness: Alert Mood: Depressed Affect: Appropriate to circumstance Anxiety Level: Severe Thought Processes: Coherent, Relevant Judgement: Impaired Orientation: Person, Place, Time, Situation Obsessive Compulsive Thoughts/Behaviors: None  Cognitive Functioning Concentration: Normal Memory: Recent Intact, Remote Intact IQ: Average Insight: Poor Impulse Control: Poor Appetite: Fair Weight Loss: 0 Weight Gain: 0 Sleep: Decreased Total Hours of Sleep: 6 Vegetative Symptoms: None  ADLScreening [facility]) Patient's cognitive ability adequate to safely complete daily activities?: Yes Patient able to express need for assistance with ADLs?: Yes Independently performs ADLs?:  Yes (appropriate for developmental age)  Prior Inpatient Therapy Prior Inpatient Therapy: No  Prior Outpatient Therapy Prior Outpatient Therapy: No Does patient have an ACCT team?: No Does patient have Intensive In-House Services?  : No Does patient have [facility] services? : No Does patient have P4CC services?: No  ADL Screening (condition at time of admission) Patient's cognitive ability adequate to safely complete daily activities?: Yes Is the patient deaf or have difficulty hearing?: No Does the patient have difficulty seeing, even when wearing glasses/contacts?: No Does the patient have difficulty concentrating, remembering, or making decisions?: No Patient able to express need for assistance with ADLs?: Yes Does the patient have difficulty dressing or bathing?: No Independently performs ADLs?: Yes (appropriate for developmental age)       Abuse/Neglect Assessment (Assessment to be complete while patient is alone) Physical Abuse: Denies Verbal Abuse: Denies Sexual Abuse: Denies     Merchant navy officerAdvance Directives (For Healthcare) Does Patient Have a Medical Advance Directive?: No    Additional Information 1:1 In Past 12 Months?: No CIRT Risk: No Elopement Risk: No Does patient have medical clearance?: Yes     Disposition:  Disposition Initial Assessment Completed for this Encounter: Yes Disposition of Patient: Inpatient treatment program Type of inpatient treatment program: Adult  Westley Hummershley H Kamelia Lampkins 01/25/2017 6:56 PM

## 2017-01-25 NOTE — ED Provider Notes (Signed)
[facility]   CSN: [id number] Arrival date & time: 01/25/17  1241     History   Chief Complaint Chief Complaint  Patient presents with  . Suicidal    HPI Heidi Molina is a 36 y.o. female.CC: Worsening depression, suicidal, "I tried to jump out of the car on the way over here".  HPI :  This is a 36 year old female. She states that she has been depressed for several months. States this is not worsening. States that she was brought in by her boyfriend today. She states that "just life" has been making her symptoms worse. She feels depressed. She is concerned she may hurt herself. She states that his difficulty with "my job, my bills, my department, my family, and my whole life". Denies any overdose or attempt at self-harm.  She states she felt like jumping out of the car and opened the door. Her boyfriend intervened. Past Medical History:  Diagnosis Date  . Depression     There are no active problems to display for this patient.   Past Surgical History:  Procedure Laterality Date  . APPENDECTOMY    . TUBAL LIGATION      OB History    No data available       Home Medications    Prior to Admission medications   Medication Sig Start Date End Date Taking? Authorizing Provider  mupirocin ointment (BACTROBAN) 2 % Apply 1 application topically 2 (two) times daily. 06/18/16   Charm RingsHonig, Erin J, MD  terbinafine (LAMISIL) 1 % cream Apply 1 application topically 2 (two) times daily. Patient taking differently: Apply 1 application topically 2 (two) times daily. DID NOT GET FILLED 06/16/16   Muthersbaugh, Dahlia ClientHannah, PA-C    Family History No family history on file.  Social History Social History  Substance Use Topics  . Smoking status: Current Every Day Smoker    Packs/day: 1.00    Types: Cigarettes  . Smokeless tobacco: Never Used  . Alcohol use Yes     Comment: daily - 8 beers per day     Allergies   Patient has no known allergies.   Review of  Systems Review of Systems  Constitutional: Negative for appetite change, chills, diaphoresis, fatigue and fever.  HENT: Negative for mouth sores, sore throat and trouble swallowing.   Eyes: Negative for visual disturbance.  Respiratory: Negative for cough, chest tightness, shortness of breath and wheezing.   Cardiovascular: Negative for chest pain.  Gastrointestinal: Negative for abdominal distention, abdominal pain, diarrhea, nausea and vomiting.  Endocrine: Negative for polydipsia, polyphagia and polyuria.  Genitourinary: Negative for dysuria, frequency and hematuria.  Musculoskeletal: Negative for gait problem.  Skin: Negative for color change, pallor and rash.  Neurological: Negative for dizziness, syncope, light-headedness and headaches.  Hematological: Does not bruise/bleed easily.  Psychiatric/Behavioral: Positive for dysphoric mood and suicidal ideas. Negative for behavioral problems and confusion.     Physical Exam Updated Vital Signs BP (!) 144/100   Pulse 88   Temp 98.2 F (36.8 C) (Oral)   Resp 18   Ht 5' (1.524 m)   Wt 59 kg (130 lb)   LMP 12/04/2016   SpO2 100%   BMI 25.39 kg/m   Physical Exam  Constitutional: She is oriented to person, place, and time. She appears well-developed and well-nourished. No distress.  HENT:  Head: Normocephalic.  Eyes: Conjunctivae are normal. Pupils are equal, round, and reactive to light. No scleral icterus.  Neck: Normal range of motion. Neck supple. No  thyromegaly present.  Cardiovascular: Normal rate and regular rhythm.  Exam reveals no gallop and no friction rub.   No murmur heard. Pulmonary/Chest: Effort normal and breath sounds normal. No respiratory distress. She has no wheezes. She has no rales.  Abdominal: Soft. Bowel sounds are normal. She exhibits no distension. There is no tenderness. There is no rebound.  Musculoskeletal: Normal range of motion.  Neurological: She is alert and oriented to person, place, and time.    Skin: Skin is warm and dry. No rash noted.  Psychiatric: She has a normal mood and affect. Her behavior is normal.     ED Treatments / Results  Labs (all labs ordered are listed, but only abnormal results are displayed) Labs Reviewed  ETHANOL - Abnormal; Notable for the following:       Result Value   Alcohol, Ethyl (B) 126 (*)    All other components within normal limits  ACETAMINOPHEN LEVEL - Abnormal; Notable for the following:    Acetaminophen (Tylenol), Serum <10 (*)    All other components within normal limits  CBC - Abnormal; Notable for the following:    Platelets 435 (*)    All other components within normal limits  RAPID URINE DRUG SCREEN, HOSP PERFORMED - Abnormal; Notable for the following:    Tetrahydrocannabinol POSITIVE (*)    All other components within normal limits  COMPREHENSIVE METABOLIC PANEL  SALICYLATE LEVEL  I-STAT BETA HCG BLOOD, ED ([facility], [facility], [facility] ONLY)    EKG  EKG Interpretation None       Radiology No results found.  Procedures Procedures (including critical care time)  Medications Ordered in ED Medications  ibuprofen (ADVIL,MOTRIN) tablet 400 mg (400 mg Oral Given 01/25/17 1711)     Initial Impression / Assessment and Plan / ED Course  I have reviewed the triage vital signs and the nursing notes.  Pertinent labs & imaging results that were available during my care of the patient were reviewed by me and considered in my medical decision making (see chart for details).    Patient otherwise healthy. Medically clear for exam and laboratories. Admits to [facility] and this is positive on her screen. Alcohol elevated. Appropriate for her health evaluation at this time.  Final Clinical Impressions(s) / ED Diagnoses   Final diagnoses:  Depression, unspecified depression type  Suicidal ideation    New Prescriptions New Prescriptions   No medications on file     Rolland Porter, MD 01/25/17 1729

## 2017-01-25 NOTE — ED Notes (Signed)
Pt requested lemon cookies and ginger ale for snack. Pt given the same.

## 2017-01-25 NOTE — ED Notes (Signed)
Patient made 1 phone call. 

## 2017-01-25 NOTE — ED Notes (Signed)
Pt made 1 phone call. 

## 2017-01-25 NOTE — BH Assessment (Signed)
Heidi Jacksanika Lewis DNP recommends inpatient treatment.   [facility]  Patient assigned to room 304-2 Accepting physician is Dr. Jama Flavorsobos  Call report to [phone] Patient can come after 9pm Spoke with Kendal HymenBonnie the patients nurse

## 2017-01-25 NOTE — ED Notes (Signed)
[facility]. Pt and her belongings/paperwork sent with [facility] associate.

## 2017-01-25 NOTE — ED Notes (Signed)
Patient fiance called , while patient was getting TTS, so after it was finish, patient was told that Fiance said call him, Patient did not call him back when I told her.

## 2017-01-26 ENCOUNTER — Encounter (HOSPITAL_COMMUNITY): Payer: Self-pay

## 2017-01-26 DIAGNOSIS — R45851 Suicidal ideations: Secondary | ICD-10-CM

## 2017-01-26 DIAGNOSIS — F322 Major depressive disorder, single episode, severe without psychotic features: Principal | ICD-10-CM

## 2017-01-26 DIAGNOSIS — F1990 Other psychoactive substance use, unspecified, uncomplicated: Secondary | ICD-10-CM

## 2017-01-26 LAB — LIPID PANEL
Cholesterol: 169 mg/dL (ref 0–200)
HDL: 66 mg/dL (ref >40)
LDL CALC: 82 mg/dL (ref 0–99)
Total CHOL/HDL Ratio: 2.6 RATIO
Triglycerides: 105 mg/dL (ref <150)
VLDL: 21 mg/dL (ref 0–40)

## 2017-01-26 LAB — TSH: TSH: 0.764 u[IU]/mL (ref 0.350–4.500)

## 2017-01-26 MED ORDER — ONDANSETRON 4 MG PO TBDP: 4.0000 mg | ORAL_TABLET | Freq: Four times a day (QID) | ORAL | Status: DC | PRN

## 2017-01-26 MED ORDER — NALTREXONE HCL 50 MG PO TABS
50.0000 mg | ORAL_TABLET | Freq: Every day | ORAL | Status: DC
  Administered 2017-01-26 – 2017-01-28 (×3): 50 mg via ORAL
  Filled 2017-01-26 (×4): qty 1

## 2017-01-26 MED ORDER — LOPERAMIDE HCL 2 MG PO CAPS: 2.0000 mg | ORAL_CAPSULE | ORAL | Status: DC | PRN

## 2017-01-26 MED ORDER — ADULT MULTIVITAMIN W/MINERALS CH
1.0000 | ORAL_TABLET | Freq: Every day | ORAL | Status: DC
  Administered 2017-01-26 – 2017-01-28 (×3): 1 via ORAL
  Filled 2017-01-26 (×4): qty 1

## 2017-01-26 MED ORDER — NICOTINE POLACRILEX 2 MG MT GUM
2.0000 mg | CHEWING_GUM | OROMUCOSAL | Status: DC | PRN
Start: 2017-01-26 — End: 2017-01-28

## 2017-01-26 MED ORDER — LORAZEPAM 1 MG PO TABS
ORAL_TABLET | ORAL | Status: AC
  Filled 2017-01-26: qty 1

## 2017-01-26 MED ORDER — LORAZEPAM 1 MG PO TABS
1.0000 mg | ORAL_TABLET | Freq: Once | ORAL | Status: AC
  Administered 2017-01-26: 1 mg via ORAL

## 2017-01-26 MED ORDER — SERTRALINE HCL 25 MG PO TABS
25.0000 mg | ORAL_TABLET | Freq: Every day | ORAL | Status: DC
  Administered 2017-01-26 – 2017-01-28 (×3): 25 mg via ORAL
  Filled 2017-01-26 (×4): qty 1

## 2017-01-26 MED ORDER — VITAMIN B-1 100 MG PO TABS
100.0000 mg | ORAL_TABLET | Freq: Every day | ORAL | Status: DC
  Administered 2017-01-27 – 2017-01-28 (×2): 100 mg via ORAL
  Filled 2017-01-26 (×4): qty 1

## 2017-01-26 MED ORDER — LORAZEPAM 1 MG PO TABS
1.0000 mg | ORAL_TABLET | Freq: Four times a day (QID) | ORAL | Status: DC | PRN
  Administered 2017-01-27: 1 mg via ORAL
  Filled 2017-01-26: qty 1

## 2017-01-26 NOTE — Tx Team (Signed)
Initial Treatment Plan 01/26/2017 12:48 AM Salome Holmesiffany Widmann ZOX:[id number]    PATIENT STRESSORS: Financial difficulties Occupational concerns Substance abuse   PATIENT STRENGTHS: Wellsite geologistCommunication skills General fund of knowledge Motivation for treatment/growth Supportive family/friends   PATIENT IDENTIFIED PROBLEMS: Depression  Suicidal ideation  "I want help with my depression"  "I would like help with getting a medical doctor in the the community to help with my hypertension"               DISCHARGE CRITERIA:  Improved stabilization in mood, thinking, and/or behavior Motivation to continue treatment in a less acute level of care Verbal commitment to aftercare and medication compliance  PRELIMINARY DISCHARGE PLAN: Outpatient therapy Medication management  PATIENT/FAMILY INVOLVEMENT: This treatment plan has been presented to and reviewed with the patient, Heidi Molina.  The patient and family have been given the opportunity to ask questions and make suggestions.  Levin BaconHeather V Breindy Meadow, RN 01/26/2017, 12:48 AM

## 2017-01-26 NOTE — [facility] Group Notes (Signed)
[facility] Group Notes:  (Nursing/MHT/Case Management/Adjunct)  Date:  01/26/2017  Time:  3:00 PM  Type of Therapy:  Nurse Education  Participation Level:  Active  Participation Quality:  Appropriate and Attentive  Affect:  Appropriate  Cognitive:  Alert and Appropriate  Insight:  Appropriate  Engagement in Group:  Engaged  Modes of Intervention:  Activity, Discussion and Education  Summary of Progress/Problems: Today's group focused on different perspectives. Patient's were asked to identify an irrational thought. Pictures with different illusions were then passed around and patient's were asked to reveal what they saw.  A conversation was had about how in every situation there are many ways to look at the same problem and how we always have a choice to see things in a different perspective.  At the end of the group, patient's were asked to repeat their irrational thought and replace it with a different positive statement.  Heidi Molina attended and actively participated in group and offered support to peers.  [name] P 01/26/2017, 3:00 PM  

## 2017-01-26 NOTE — [facility] Group Notes (Signed)
[facility] LCSW Group Therapy Note  01/26/2017  and  10:15 AM  Type of Therapy and Topic:  Group Therapy: Avoiding Self-Sabotaging and Enabling Behaviors  Participation Level:  Active  Participation Quality:  Appropriate  Affect:  Appropriate  Cognitive:  Alert and Oriented  Insight:  Limited  Engagement in Therapy:  Improving   Therapeutic models used: Cognitive Behavioral Therapy,  Person-Centered Therapy and Motivational Interviewing  Modes of Intervention:  Discussion, Exploration, Orientation, Rapport Building, Socialization and Support   Summary of Progress/Problems:  The main focus of today's process group was for the patient to identify ways in which they have in the past sabotaged their own recovery. Motivational Interviewing was utilized to identify motivation they may have for wanting to change. The Stages of Change were explained using a handout, and patients identified where they currently are with regard to stages of change. Patient shared how she often uses alcohol to medicate when feeling overwhelmed often resulting in feeling worse. Patient states she is in contemplation stage of change.    Carney Bernatherine C Harrill, LCSW
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
## 2017-01-26 NOTE — Progress Notes (Signed)
Nursing Note: 0700-1900  D:  Pt presents with depressed mood and anxious affect stating that her life has become overwhelming and "I drink about 6-7 beers a day to put me to sleep."  Also reports lability of mood "Sometimes I cry and then other times I get mad and throw things.  Pt starting Zoloft and Depade today.  BP elevated 166/105 and pulse: 70, MD aware- will continue to monitor.  Pt completed Suicide Safety Plan, copy placed in chart (original given back to the patient.)  Pt listed the following as her current coping skills: " Listen to music, drink beer, smoke pot, go to sleep, sit outside and isolate myself, play on my phone, talk to my daughter or boyfriend, watch tv and smoke cigarettes."  A:  Encouraged to verbalize needs and concerns, active listening and support provided.  Continued Q 15 minute safety checks.    R:  Pt. is calm and cooperative. She shares that she wants to live for her kids, "I don't want to hurt myself knowing that my 2 kids will never see me again."  Denies A/V hallucinations and is able to verbally contract for safety.
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
## 2017-01-26 NOTE — Progress Notes (Signed)
Patient did attend the evening speaker AA meeting.  

## 2017-01-26 NOTE — [facility] Suicide Risk Assessment (Signed)
[facility]   Nursing information obtained from:  Patient Demographic factors:  NA Current Mental Status:  NA Loss Factors:  Financial problems / change in socioeconomic status Historical Factors:  NA Risk Reduction Factors:  Living with another person, especially a relative  Total Time spent with patient: 45 minutes Principal Problem: <principal problem not specified> Diagnosis:   Patient Active Problem List   Diagnosis Date Noted  . 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 Background history of Alcohol use Disorder and Mood disorder. Brought to the ER by her boyfriend. Reports worsening depression and thoughts of suicide. Attempted to stab her wrist and attempted to jump off the vehicle. Reports a lot of stressors "my job,,,, bills ,,, family conflict". Has been drinking regularly. Ruminating about her losses. UDS was positive for THC. BAL was 126 mg/dl.  Recently bought a [employer]il [employer]1997 [employer]. Says it has been having a lot of mechanical problems. She has not been able to make it to work some days. Under pressure at work. Reports being behind in a lot of bills. Still has to take care of her extended family. Says she gets overwhelmed and gets angry at self. Wants to get help. Wants to be there for her children. No past suicidal behavior. Says she has had the thoughts while intoxicated. She has held the knife to self but never cut self.  Reports family history of alcoholism. Has been drinking heavily for almost two decades. Daily pattern of drinking. Use has been affecting her life. Has missed days at work. Has had blackouts. When overwhelmed turns to alcohol.  Never been in addiction treatment. Not considering it now as she needs to support her family. No access to weapons We discussed targeting depression with Sertraline. She consented to treatment after we reviewed the risks  and benefits. We discussed use of naltrexone to address cravings. Patient consented to treatment after we explored the risks and benefits    Continued Clinical Symptoms:  Alcohol Use Disorder Identification Test Final Score (AUDIT): 7 The "Alcohol Use Disorders Identification Test", Guidelines for Use in Primary Care, Second Edition.  World Science writerHealth Organization [facility](WHO). Score between 0-7:  no or low risk or alcohol related problems. Score between 8-15:  moderate risk of alcohol related problems. Score between 16-19:  high risk of alcohol related problems. Score 20 or above:  warrants further diagnostic evaluation for alcohol dependence and treatment.   CLINICAL FACTORS:   Mood swings  SUD   Musculoskeletal: Strength & Muscle Tone: within normal limits Gait & Station: normal Patient leans: N/A  Psychiatric Specialty Exam: Physical Exam  Constitutional: She is oriented to person, place, and time. She appears well-developed and well-nourished.  HENT:  Head: Normocephalic.  Eyes: Conjunctivae are normal. Pupils are equal, round, and reactive to light.  Neck: Normal range of motion.  Cardiovascular: Normal rate.   Respiratory: Effort normal.  Musculoskeletal: Normal range of motion.  Neurological: She is alert and oriented to person, place, and time.  Skin: Skin is warm and dry.  Psychiatric:  As above    ROS  Blood pressure (!) 151/108, pulse 90, temperature 98.3 F (36.8 C), resp. rate 16, height 5' (1.524 m), weight 59.9 kg (132 lb).Body mass index is 25.78 kg/m.  General Appearance: Casually dressed. Was in groups just before interview. Not shaky, not sweaty, not confused. Not unsteady, normal conjugate eye movements. Not internally distressed. Appropriate behavior.  Eye Contact:  Good  Speech:  Clear and Coherent  Volume:  Normal  Mood:  Depressed and Irritable  Affect:  Congruent  Thought Process:  Linear  Orientation:  Full (Time, Place, and Person)  Thought Content:   Rumination  Suicidal Thoughts:  No  Homicidal Thoughts:  No  Memory:  Immediate;   Fair Recent;   Fair Remote;   Fair  Judgement:  Fair  Insight:  Good  Psychomotor Activity:  Normal  Concentration:  Concentration: Fair and Attention Span: Fair  Recall:  [employer] of Knowledge:  Good  Language:  Good  Akathisia:  Negative  Handed:    AIMS (if indicated):     Assets:  Communication Skills Desire for Improvement Housing Intimacy Physical Health  ADL's:  Intact  Cognition:  WNL  Sleep:  Number of Hours: 4.5      COGNITIVE FEATURES THAT CONTRIBUTE TO RISK:  None    SUICIDE RISK:   Moderate:  Frequent suicidal ideation with limited intensity, and duration, some specificity in terms of plans, no associated intent, good self-control, limited dysphoria/symptomatology, some risk factors present, and identifiable protective factors, including available and accessible social support.  PLAN OF CARE:  1. Suicide precautions 2. Alcohol withdrawal protocol 3. Sertraline 25 mg daily. Would titrate as tolerated 4. Naltrexone 50 mg daily   I certify that inpatient services furnished can reasonably be expected to improve the patient's condition.   Georgiann Cocker, MD 01/26/2017, 10:55 AM

## 2017-01-26 NOTE — Progress Notes (Signed)
Pt did not attend group. Pt. Remained in bed, encouraged to attend group.

## 2017-01-27 MED ORDER — CLONIDINE HCL 0.1 MG PO TABS
0.1000 mg | ORAL_TABLET | Freq: Once | ORAL | Status: AC
  Administered 2017-01-27: 0.1 mg via ORAL
  Filled 2017-01-27 (×2): qty 1

## 2017-01-27 NOTE — Plan of Care (Signed)
Problem: Education: Goal: Utilization of techniques to improve thought processes will improve Outcome: Progressing Nurse discussed depression/anxiety/coping skills with patient.    

## 2017-01-27 NOTE — Progress Notes (Signed)
D- Patient is in a depressed mood. Pleasant and brightens on approach.  Denies SI, HI, AVH, and pain. Patient reports that today was a "good day". Patient reports that the best part of her day was seeing her children during visitation and talking with peers. Patient verbalizes readiness to go home and states "I know I can do it".  Patient's diastolic blood pressure was elevated this shift which was reports to Baron HamperJason B, NP.  Patient reports that high blood pressure runs in her family and believes that her elevated blood pressure is not related to withdrawal symptoms.  No other complaints. A- Support and encouragement provided.  Routine safety checks conducted every 15 minutes.  Patient informed to notify staff with problems or concerns. R- Patient contracts for safety at this time. Patient receptive, calm, and cooperative. Patient interacts well with others on the unit.  Patient remains safe at this time.

## 2017-01-27 NOTE — Progress Notes (Addendum)
DAR Note: Patient seen on dayroom watching TV and interacting well with peers. Complained of headache of 6/10. Denies SI/HI, AH/VH at this time. Verbalizes no concern. No behavior issues noted.  Patient's blood pressure was high this night (see MAR). Barbara CowerJason NP notified. Ordered one time clonidine 0.1mg . Bp rechecked at 12 am. Medication was effective (see MAR).  Will continue to monitor patient.

## 2017-01-27 NOTE — Progress Notes (Signed)
D:  Patient's self inventory sheet, patient has fair sleep, no sleep medication.  Fair appetite, normal energy level, good concentration.  Denied depression, hopeless and anxiety.  Denied withdrawals.  Denied SI.  Denied physical problems.  Denied physical pain.  Most important goal is stay focused and get back to family and job.  Plans to pray, talk to others, meditate, rest.  Needs to return home for children and job, not mental.  Never been in trouble with law.  Does not want to hurt herself.  Was just in depressed mood. A:  Medications administered per MD orders.  Emotional support and encouragement given patient. R:  Denied SI and HI, contracts for safety.  Denied A/V hallucinations.  Safety maintained with 15 minute checks.

## 2017-01-27 NOTE — Progress Notes (Signed)
Patient did attend the evening speaker AA meeting.  

## 2017-01-27 NOTE — Progress Notes (Signed)
[facility] MD Progress Note  01/27/2017 12:46 PM Heidi Molina  MRN:  [id number] Subjective:   36 yo AAF, single, lives with her children. Background history of Alcohol use Disorder and Mood disorder. Brought to the ER by her boyfriend. Reports worsening depression and thoughts of suicide. Attempted to stab her wrist and attempted to jump off the vehicle. Reports a lot of stressors "my job,,,, bills ,,, family conflict". Has been drinking regularly. Ruminating about her losses. UDS was positive for THC. BAL was 126 mg/dl.  Chart reviewed today. Patient discussed at team  Staff reports that she has been interacting well with peers. She has been attending unit groups and activities. No behavioral issues. Her family visited her yesterday. She enjoyed her time with them. She reports desire to stay sober and be there for her kids.  Seen today. Says she is in good spirits. No sweatiness, no headaches. No retching, nausea or vomiting. No fullness in the head. No visual, tactile or auditory hallucination. No internal restlessness. No suicidal or homicidal thoughts. No violent thoughts. Says she is glad she came in here. She has had time to process the effects alcohol has been having on her life. Says she hopes to attend [facility] and stay sober when she gets discharged.     Principal Problem: Severe major depression without psychotic features (HCC) Diagnosis:   Patient Active Problem List   Diagnosis Date Noted  . Substance use disorder [F19.90] 01/26/2017  . 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 Depression     Past Surgical History:  Procedure Laterality Date  . APPENDECTOMY    . TUBAL LIGATION     Family History: History reviewed. No pertinent family history. Family Psychiatric  History: As in H&P Social History:   History  Alcohol Use  . Yes    Comment: daily - 8 beers per day     History  Drug Use  . Types: Marijuana    Social History   Social History  . Marital status: Single    Spouse name: N/A  . Number of children: N/A  . Years of education: N/A   Social History Main Topics  . Smoking status: Current Every Day Smoker    Packs/day: 1.00    Types: Cigarettes  . Smokeless tobacco: Never Used  . Alcohol use Yes     Comment: daily - 8 beers per day  . Drug use: Yes    Types: Marijuana  . Sexual activity: Not Asked   Other Topics Concern  . None   Social History Narrative  . None   Additional Social History:    Pain Medications: see MAR Prescriptions: see MAR Over the Counter: see MAR History of alcohol / drug use?: Yes Name of Substance 1: alcohol 1 - Age of First Use: uknown  1 - Amount (size/oz): coors light, 3-6 beers 1 - Frequency: daily 1 - Duration: months 1 - Last Use / Amount: today Name of Substance 2: cannabis 2 - Age of First Use: 15 2 - Amount (size/oz): 3 blunts 2 - Frequency: daily 2 - Duration: years 2 - Last Use / Amount: today     Sleep: Good  Appetite:  Good  Current Medications: Current Facility-Administered Medications  Medication Dose Route Frequency Provider Last Rate Last Dose  . acetaminophen (TYLENOL) tablet 650  mg  650 mg Oral Q6H PRN Nira Conn A, NP   650 mg at 01/25/17 2353  . alum & mag hydroxide-simeth (MAALOX/MYLANTA) 200-200-20 MG/5ML suspension 30 mL  30 mL Oral Q4H PRN Nira Conn A, NP      . hydrOXYzine (ATARAX/VISTARIL) tablet 25 mg  25 mg Oral TID PRN Nira Conn A, NP      . loperamide (IMODIUM) capsule 2-4 mg  2-4 mg Oral PRN Nira Conn A, NP      . LORazepam (ATIVAN) tablet 1 mg  1 mg Oral Q6H PRN Nira Conn A, NP      . magnesium hydroxide (MILK OF MAGNESIA) suspension 30 mL  30 mL Oral Daily PRN Nira Conn A, NP      . multivitamin with minerals tablet 1 tablet  1 tablet Oral Daily Nira Conn A, NP   1  tablet at 01/27/17 0825  . naltrexone (DEPADE) tablet 50 mg  50 mg Oral Daily Izediuno, Delight Ovens, MD   50 mg at 01/27/17 4540  . nicotine polacrilex (NICORETTE) gum 2 mg  2 mg Oral PRN Cobos, Rockey Situ, MD      . ondansetron (ZOFRAN-ODT) disintegrating tablet 4 mg  4 mg Oral Q6H PRN Nira Conn A, NP      . sertraline (ZOLOFT) tablet 25 mg  25 mg Oral Daily Izediuno, Delight Ovens, MD   25 mg at 01/27/17 0825  . thiamine (VITAMIN B-1) tablet 100 mg  100 mg Oral Daily Nira Conn A, NP   100 mg at 01/27/17 0827  . traZODone (DESYREL) tablet 50 mg  50 mg Oral QHS PRN Jackelyn Poling, NP        Lab Results:  Results for orders placed or performed during the hospital encounter of 01/25/17 (from the past 48 hour(s))  Lipid panel     Status: None   Collection Time: 01/26/17  6:14 AM  Result Value Ref Range   Cholesterol 169 0 - 200 mg/dL   Triglycerides 981 <191 mg/dL   HDL 66 >47 mg/dL   Total CHOL/HDL Ratio 2.6 RATIO   VLDL 21 0 - 40 mg/dL   LDL Cholesterol 82 0 - 99 mg/dL    Comment:        Total Cholesterol/HDL:CHD Risk Coronary Heart Disease Risk Table                     Men   Women  1/2 Average Risk   3.4   3.3  Average Risk       5.0   4.4  2 X Average Risk   9.6   7.1  3 X Average Risk  23.4   11.0        Use the calculated Patient Ratio above and the CHD Risk Table to determine the patient's CHD Risk.        ATP III CLASSIFICATION (LDL):  <100     mg/dL   Optimal  829-562  mg/dL   Near or Above                    Optimal  130-159  mg/dL   Borderline  130-865  mg/dL   High  >784     mg/dL   Very High Performed at [facility], [address], [place], [place] [address]   TSH     Status: None   Collection Time: 01/26/17  6:14 AM  Result Value Ref Range  TSH 0.764 0.350 - 4.500 uIU/mL    Comment: Performed by a 3rd Generation assay with a functional sensitivity of <=0.01 uIU/mL. Performed at [facility], [address] [place], [place]  [id number]     Blood Alcohol level:  Lab Results  Component Value Date   ETH 126 (H) 01/25/2017    Metabolic Disorder Labs: No results found for: HGBA1C, MPG No results found for: PROLACTIN Lab Results  Component Value Date   CHOL 169 01/26/2017   TRIG 105 01/26/2017   HDL 66 01/26/2017   CHOLHDL 2.6 01/26/2017   VLDL 21 01/26/2017   LDLCALC 82 01/26/2017    Physical Findings: AIMS: Facial and Oral Movements Muscles of Facial Expression: None, normal Lips and Perioral Area: None, normal Jaw: None, normal Tongue: None, normal,Extremity Movements Upper (arms, wrists, hands, fingers): None, normal Lower (legs, knees, ankles, toes): None, normal, Trunk Movements Neck, shoulders, hips: None, normal, Overall Severity Severity of abnormal movements (highest score from questions above): None, normal Incapacitation due to abnormal movements: None, normal Patient's awareness of abnormal movements (rate only patient's report): No Awareness, Dental Status Current problems with teeth and/or dentures?: No Does patient usually wear dentures?: No  CIWA:  CIWA-Ar Total: 1 COWS:     Musculoskeletal: Strength & Muscle Tone: within normal limits Gait & Station: normal Patient leans: N/A  Psychiatric Specialty Exam: Physical Exam  ROS  Blood pressure (!) 145/112, pulse 96, temperature 99.1 F (37.3 C), temperature source Oral, resp. rate 16, height 5' (1.524 m), weight 59.9 kg (132 lb).Body mass index is 25.78 kg/m.  General Appearance: Calm and cooperative. No tremors. No sweatiness. Appropriate behavior.   Eye Contact:  Good  Speech:  Clear and Coherent and Normal Rate  Volume:  Normal  Mood:  Euthymic  Affect:  Appropriate and Full Range  Thought Process:  Linear  Orientation:  Full (Time, Place, and Person)  Thought Content:  Future oriented. No delusional theme. No preoccupation with violent thoughts. No negative ruminations. No obsession.  No hallucination in any modality.    Suicidal Thoughts:  No  Homicidal Thoughts:  No  Memory:  Immediate;   Good Recent;   Good Remote;   Good  Judgement:  Good  Insight:  Good  Psychomotor Activity:  Normal  Concentration:  Concentration: Good and Attention Span: Good  Recall:  Good  Fund of Knowledge:  Good  Language:  Good  Akathisia:  Negative  Handed:    AIMS (if indicated):     Assets:  Communication Skills Desire for Improvement Housing Intimacy Physical Health Resilience Social Support  ADL's:  Intact  Cognition:  WNL  Sleep:  Number of Hours: 5     Treatment Plan Summary: Patient is coming off effects of alcohol smoothly. She is motivated to stay sober. No dangerousness. She has family support. We plan to evaluate her further. Hopeful discharge tomorrow if she maintains stability.  Psychiatric: SUD (Alcohol, THC) MDD  Medical:  Psychosocial:  Financial constraints   PLAN: 1. Continue current regimen 2. Monitor mood, behavior and interaction with peers 3. SW would facilitate aftercare.    Georgiann CockerVincent A Izediuno, MD 01/27/2017, 12:46 PM

## 2017-01-27 NOTE — BHH Group Notes (Signed)
B[facility] LCSW Group Therapy  01/27/2017 10 AM  Type of Therapy:  Group Therapy  Participation Level:  Active  Participation Quality:  Attentive  Affect:  Appropriate  Cognitive:  Alert and Oriented  Insight:  Developing/Improving  Engagement in Therapy:  Developing/Improving  Modes of Intervention:  Discussion, Education, Exploration, Problem-solving, Rapport Building, Socialization and Support  Summary of Progress/Problems: Topic for today was thoughts and feelings regarding discharge. We discussed fears of upcoming changes including judgements, expectations and stigma of mental health issues. We then discussed supports: what constitutes a supportive framework, identification of supports and what to do when others are not supportive. Patients then discussed different options to deal with anger. Patient was attentive and shared need for better self care verses focusing on others.   Carney Bernatherine C Harrill, LCSW

## 2017-01-27 NOTE — BHH Group Notes (Addendum)
The focus of this group is to educate the patient on the purpose and policies of crisis stabilization and provide a format to answer questions about their admission.  The group details unit policies and expectations of patients while admitted.  Patient actively participated and had appropriate affect.  Patient was alert.  Patient had appropriate insight and appropriate engagement.  Today patient will work on 3 goals for discharge. Patient had wonderful insight during group that she shared with other patients.  Beautiful spirit to encourage other patients to pursue their life goals to help other people who are going through difficult family situations which involve drugs/alcohol, etc.  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

## 2017-01-28 MED ORDER — HYDROXYZINE HCL 25 MG PO TABS: 25.0000 mg | ORAL_TABLET | Freq: Three times a day (TID) | ORAL | 0 refills | Status: DC | PRN

## 2017-01-28 MED ORDER — SERTRALINE HCL 25 MG PO TABS: 25.0000 mg | ORAL_TABLET | Freq: Every day | ORAL | 0 refills | Status: DC

## 2017-01-28 MED ORDER — TRAZODONE HCL 50 MG PO TABS: 50.0000 mg | ORAL_TABLET | Freq: Every evening | ORAL | 0 refills | Status: DC | PRN

## 2017-01-28 MED ORDER — NALTREXONE HCL 50 MG PO TABS: 50.0000 mg | ORAL_TABLET | Freq: Every day | ORAL | 0 refills | Status: DC

## 2017-01-28 MED ORDER — NICOTINE POLACRILEX 2 MG MT GUM: 2.0000 mg | CHEWING_GUM | OROMUCOSAL | 0 refills | Status: DC | PRN

## 2017-01-28 NOTE — Progress Notes (Signed)
D:  Patient's self inventory sheet, patient sleeps good, sleep medication helpful.  Good appetite, normal energy level, good concentration.  Denied depression, anxiety and hopeless.  Denied withdrawals.  Patient has complained of headache, HTN in past 24 hours.  Denied SI.  Denied physical problems.  Denied pain, worst pain #1 in past 24 hours.  Tylenol helpful.  Goal is stay focused, on track, take one day at a time.  Don't put myself around negative behavior and continue to pray.  Staff is very helpful, nice, answered questions.  Does have discharge plans. A:  Medications administered per MD orders.  Emotional support and encouragement given patient. R:  Denied SI and HI, contracts for safety.  Denied A/V hallucinations.  Safety maintained with 15 minute checks.

## 2017-01-28 NOTE — BHH Suicide Risk Assessment (Signed)
B[facility] INPATIENT:  Family/Significant Other Suicide Prevention Education  Suicide Prevention Education:  Patient Refusal for Family/Significant Other Suicide Prevention Education: The patient Heidi Molina has refused to provide written consent for family/significant other to be provided Family/Significant Other Suicide Prevention Education during admission and/or prior to discharge.  Physician notified.  SPE completed with pt, as pt refused to consent to family contact. SPI pamphlet provided to pt and pt was encouraged to share information with support network, ask questions, and talk about any concerns relating to SPE. Pt denies access to guns/firearms and verbalized understanding of information provided. Mobile Crisis information also provided to pt.   Heidi Rosiles N Smart LCSW 01/28/2017, 8:34 AM

## 2017-01-28 NOTE — Progress Notes (Signed)
  [facility] Discharge Plan :  Will you be returning to the same living situation after discharge:  Yes,  home At discharge, do you have transportation home?: Yes,  family member Do you have the ability to pay for your medications: Yes,  mental health  Release of information consent forms completed and submitted to medical records by CSW.  Patient to Follow up at: Follow-up Information    [facility].   Specialty:  Behavioral Health Why:  Walk in Mon-Fr 8am-9am.  Contact information: [address] [place] [address] [phone]           Next level of care provider has access to [facility] Link:no  Safety Planning and Suicide Prevention discussed: Yes,  SPE completed with pt; pt declined to consent to family contact. SPI pamphlet and Mobile Crisis information provided to pt.   Have you used any form of tobacco in the last 30 days? (Cigarettes, Smokeless Tobacco, Cigars, and/or Pipes): Yes  Has patient been referred to the Quitline?: Patient refused referral  Patient has been referred for addiction treatment: Yes  Antavion Bartoszek N Smart LCSW 01/28/2017, 11:24 AM

## 2017-01-28 NOTE — Progress Notes (Signed)
Recreation Therapy Notes  Date: 01/28/17 Time: 0930 Location: [address] Dayroom  Group Topic: Stress Management  Goal Area(s) Addresses:  Patient will verbalize importance of using healthy stress management.  Patient will identify positive emotions associated with healthy stress management.   Intervention: Stress Management  Activity :  Peaceful Waves.  LRT introduced the stress management technique of guided imagery.  LRT read a script to allow patients the opportunity for take a mental journey to the beach.  Patients were to follow along as LRT read script to engage in the technique.  Education:  Stress Management, Discharge Planning.   Education Outcome: Acknowledges edcuation/In group clarification offered/Needs additional education  Clinical Observations/Feedback: Pt did not attend group.   Dorine Duffey Lillia AbedLIndsay, LRT/CTRS         Lillia AbedLindsay, Oswald Pott A 01/28/2017 12:17 PM

## 2017-01-28 NOTE — Progress Notes (Signed)
Discharge Note:  Patient discharged home with family member.  Patient denied SI and HI.  Denied A/V hallucinations.  Suicide prevention information given and discussed with patient who stated she understood and had no questions.  Patient stated she received all her belongings, clothing, toiletries, prescriptions, medications, etc.  Patient stated she appreciated all assistance received [facility].  All required discharge information given to patient at discharge.

## 2017-01-28 NOTE — [facility] Suicide Risk Assessment (Signed)
[facility] Discharge Suicide Risk Assessment   Principal Problem: Severe major depression without psychotic features [facility]) Discharge Diagnoses:  Patient Active Problem List   Diagnosis Date Noted  . Substance use disorder [F19.90] 01/26/2017  . Severe major depression without psychotic features (HCC) [F32.2] 01/25/2017    Total Time spent with patient: 45 minutes  Musculoskeletal: Strength & Muscle Tone: within normal limits Gait & Station: normal Patient leans: N/A  Psychiatric Specialty Exam: Review of Systems  Constitutional: Negative.   HENT: Negative.   Eyes: Negative.   Respiratory: Negative.   Cardiovascular: Negative.   Gastrointestinal: Negative.   Genitourinary: Negative.   Musculoskeletal: Negative.   Skin: Negative.   Neurological: Negative.   Endo/Heme/Allergies: Negative.   Psychiatric/Behavioral: Negative for depression, hallucinations, memory loss, substance abuse and suicidal ideas. The patient is not nervous/anxious and does not have insomnia.     Blood pressure 128/80, pulse 84, temperature 98.7 F (37.1 C), temperature source Oral, resp. rate 16, height 5' (1.524 m), weight 59.9 kg (132 lb).Body mass index is 25.78 kg/m.  General Appearance: Neatly dressed, pleasant, engaging well and cooperative. Appropriate behavior. Not in any distress. Good relatedness. Not internally stimulated  Eye Contact::  Good  Speech:  Spontaneous, normal prosody. Normal tone and rate.   Volume:  Normal  Mood:  Euthymic  Affect:  Appropriate and Full Range  Thought Process:  Linear  Orientation:  Full (Time, Place, and Person)  Thought Content:  No delusional theme. No preoccupation with violent thoughts. No negative ruminations. No obsession.  No hallucination in any modality.   Suicidal Thoughts:  No  Homicidal Thoughts:  No  Memory:  Immediate;   Good Recent;   Good Remote;   Good  Judgement:  Good  Insight:  Good  Psychomotor Activity:  Normal  Concentration:  Good   Recall:  Good  Fund of Knowledge:Good  Language: Good  Akathisia:  NA  Handed:    AIMS (if indicated):     Assets:  Communication Skills Desire for Improvement Housing Intimacy Physical Health Social Support  Sleep:  Number of Hours: 4.5  Cognition: WNL  ADL's:  Intact   Clinical Assessment::   36 yo AAF, single, lives with her children. Background history of Alcohol use Disorder and Mood disorder. Brought to the ER by her boyfriend. Reports worsening depression and thoughts of suicide. Attempted to stab her wrist and attempted to jump off the vehicle. Reports a lot of stressors "my job,,,, bills ,,, family conflict". Has been drinking regularly. Ruminating about her losses. UDS was positive for THC. BAL was 126 mg/dl.  Seen today. Has completely come off alcohol. No sweatiness, no headaches. No retching, nausea or vomiting. No fullness in the head. No visual, tactile or auditory hallucination. No internal restlessness. No suicidal or homicidal thoughts. Says she wants to get her life back on track. She loves her children and wants to be there for them. Reports being in good spirits. She is able to think clearly. She is not feeling depressed. She does not have any thoughts of violence. No weapons at home. Her boyfriend has been supportive. Patient feels she has the potentials to deal with her stressors. She is optimistic about the future.  Nursing staff reports that patient has been appropriate on the unit. Patient has been interacting well with peers. No behavioral issues. Patient has not voiced any suicidal thoughts. Patient has not been observed to be internally stimulated. Patient has been adherent with treatment recommendations. Patient has been tolerating their  medication well.   Patient was discussed at team. Team members feels that patient is back to her baseline level of function. Team agrees with plan to discharge patient today.     Demographic Factors:  NA  Loss  Factors: Financial problems/change in socioeconomic status  Historical Factors: Impulsivity  Risk Reduction Factors:   Responsible for children under 38 years of age, Sense of responsibility to family, Religious beliefs about death, Living with another person, especially a relative, Positive social support, Positive therapeutic relationship and Positive coping skills or problem solving skills  Continued Clinical Symptoms:   As above  Cognitive Features That Contribute To Risk:  None    Suicide Risk:  Minimal: No identifiable suicidal ideation. Patient is not having any thoughts of suicide at this time. Modifiable risk factors targeted during this admission includes depression and substance use. Demographical and historical risk factors cannot be modified. Patient is now engaging well. Patient is reliable and is future oriented. We have buffered patient's support structures. At this point, patient is at low risk of suicide. Patient is aware of the effects of psychoactive substances on decision making process. Patient has been provided with emergency contacts. Patient acknowledges to use resources provided if unforseen circumstances changes their current risk stratification.    Follow-up Information    [facility] Follow up.   Specialty:  Behavioral Health Why:  Weekday CSW will arrange appointment before discharge Contact information: [address] [place] [address] [phone]           Plan Of Care/Follow-up recommendations:  1. Continue current psychotropic medications 2. Mental health and addiction follow up as arranged.  3. Discharge in care of her family 4. Provided limited quantity of prescriptions   Georgiann CockerVincent A Ellee Wawrzyniak, MD 01/28/2017, 11:11 AM

## 2017-01-28 NOTE — Discharge Summary (Signed)
Physician Discharge Summary Note  Patient:  Heidi Molina is an 36 y.o., female MRN:  [id number] DOB:  06/25/1981 Patient phone:  [phone] (home)  Patient address:   [id number] [place] [id number],  Total Time spent with patient: 30 minutes  Date of Admission:  01/25/2017 Date of Discharge: 01/28/2017  Reason for Admission: Per TTS Assessment - Heidi Overlyiffany Loveis an 36 y.o.femalepresenting to the ED after an attempt to stab her wrist and jump out of a moving car. The patient reports worsening depression over the last several months. The patient has a history of depression and suicide attempt. States she has multiped stressors including her job, family conflict, and continued grief over the loss of her mother 4 yrs ago. The patient reports increased drinking over the last month and daily cannabis use for years. Admits to mood lability, tearful at times and at other times angry. The patient denies HI or A/V.  The patient lives with her boyfriend, Doreene Adasstepson, her two children and a nephew. 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 Patient had depressed mood and affect, freedom of movement. Reports severe anxiety, had coherent thought, impaired judgment and insight.  Principal Problem: Severe major depression without psychotic features [facility](HCC) Discharge Diagnoses: Patient Active Problem List   Diagnosis Date Noted  . Substance use disorder [F19.90] 01/26/2017  . Severe major depression without psychotic features (HCC) [F32.2] 01/25/2017    Past Psychiatric History:  Past Medical History:  Past Medical History:  Diagnosis Date  . Depression     Past Surgical History:  Procedure Laterality Date  . APPENDECTOMY    . TUBAL LIGATION     Family History: History reviewed. No pertinent family history. Family Psychiatric  History:  Social History:  History  Alcohol Use  . Yes    Comment: daily - 8 beers per day     History  Drug Use  . Types: Marijuana    Social History    Social History  . Marital status: Single    Spouse name: N/A  . Number of children: N/A  . Years of education: N/A   Social History Main Topics  . Smoking status: Current Every Day Smoker    Packs/day: 1.00    Types: Cigarettes  . Smokeless tobacco: Never Used  . Alcohol use Yes     Comment: daily - 8 beers per day  . Drug use: Yes    Types: Marijuana  . Sexual activity: Not Asked   Other Topics Concern  . None   Social History Narrative  . None    Hospital Course:  Heidi Holmesiffany Reagle was admitted for Severe major depression without psychotic features (HCC)  and crisis management.  Pt was treated discharged with the medications listed below under Medication List.  Medical problems were identified and treated as needed.  Home medications were restarted as appropriate.  Improvement was monitored by observation and Heidi Holmesiffany Kwiecinski 's daily report of symptom reduction.  Emotional and mental status was monitored by daily self-inventory reports completed by Heidi Holmesiffany Mersch and clinical staff.         Heidi Molina was evaluated by the treatment team for stability and plans for continued recovery upon discharge. Heidi Holmesiffany Jobin 's motivation was an integral factor for scheduling further treatment. Employment, transportation, bed availability, health status, family support, and any pending legal issues were also considered during hospital stay. Pt was offered further treatment options upon discharge including but not limited to Residential, Intensive Outpatient, and Outpatient treatment.  Heidi Molina will follow up with the services as listed below under Follow Up Information.     Upon completion of this admission the patient was both mentally and medically stable for discharge denying suicidal/homicidal ideation, auditory/visual/tactile hallucinations, delusional thoughts and paranoia.   Heidi Molina responded well to treatment with depade 50 mg, Trazodone 50 mg  and Zoloft 25 mg without adverse  effects. Pt demonstrated improvement without reported or observed adverse effects to the point of stability appropriate for outpatient management. Pertinent labs include: THC, for which outpatient follow-up is necessary for lab recheck as mentioned below. Reviewed CBC, CMP, BAL 126 on admission, and UDS; all unremarkable aside from noted exceptions.   Physical Findings: AIMS: Facial and Oral Movements Muscles of Facial Expression: None, normal Lips and Perioral Area: None, normal Jaw: None, normal Tongue: None, normal,Extremity Movements Upper (arms, wrists, hands, fingers): None, normal Lower (legs, knees, ankles, toes): None, normal, Trunk Movements Neck, shoulders, hips: None, normal, Overall Severity Severity of abnormal movements (highest score from questions above): None, normal Incapacitation due to abnormal movements: None, normal Patient's awareness of abnormal movements (rate only patient's report): No Awareness, Dental Status Current problems with teeth and/or dentures?: No Does patient usually wear dentures?: No  CIWA:  CIWA-Ar Total: 0 COWS:  COWS Total Score: 1  Musculoskeletal: Strength & Muscle Tone: within normal limits Gait & Station: normal Patient leans: N/A  Psychiatric Specialty Exam: See SRA by MD Physical Exam  ROS  Blood pressure 128/80, pulse 84, temperature 98.7 F (37.1 C), temperature source Oral, resp. rate 16, height 5' (1.524 m), weight 59.9 kg (132 lb).Body mass index is 25.78 kg/m.   Have you used any form of tobacco in the last 30 days? (Cigarettes, Smokeless Tobacco, Cigars, and/or Pipes): Yes  Has this patient used any form of tobacco in the last 30 days? (Cigarettes, Smokeless Tobacco, Cigars, and/or Pipes) Yes, Yes, A prescription for an FDA-approved tobacco cessation medication was offered at discharge and the patient refused  Blood Alcohol level:  Lab Results  Component Value Date   ETH 126 (H) 01/25/2017    Metabolic Disorder Labs:   No results found for: HGBA1C, MPG No results found for: PROLACTIN Lab Results  Component Value Date   CHOL 169 01/26/2017   TRIG 105 01/26/2017   HDL 66 01/26/2017   CHOLHDL 2.6 01/26/2017   VLDL 21 01/26/2017   LDLCALC 82 01/26/2017    See Psychiatric Specialty Exam and Suicide Risk Assessment completed by Attending Physician prior to discharge.  Discharge destination:  Home  Is patient on multiple antipsychotic therapies at discharge:  No   Has Patient had three or more failed trials of antipsychotic monotherapy by history:  No  Recommended Plan for Multiple Antipsychotic Therapies: NA  Discharge Instructions    Diet - low sodium heart healthy    Complete by:  As directed    Discharge instructions    Complete by:  As directed    Take all medications as prescribed. Keep all follow-up appointments as scheduled.  Do not consume alcohol or use illegal drugs while on prescription medications. Report any adverse effects from your medications to your primary care provider promptly.  In the event of recurrent symptoms or worsening symptoms, call 911, a crisis hotline, or go to the nearest emergency department for evaluation.   Increase activity slowly    Complete by:  As directed      Allergies as of 01/28/2017   No Known Allergies  Medication List    STOP taking these medications   mupirocin ointment 2 % Commonly known as:  BACTROBAN   terbinafine 1 % cream Commonly known as:  LAMISIL     TAKE these medications     Indication  hydrOXYzine 25 MG tablet Commonly known as:  ATARAX/VISTARIL Take 1 tablet (25 mg total) by mouth 3 (three) times daily as needed for anxiety.  Indication:  Anxiety Neurosis   naltrexone 50 MG tablet Commonly known as:  DEPADE Take 1 tablet (50 mg total) by mouth daily. Start taking on:  01/29/2017  Indication:  Excessive Use of Alcohol, Opioid Dependence   nicotine polacrilex 2 MG gum Commonly known as:  NICORETTE Take 1 each (2  mg total) by mouth as needed for smoking cessation.  Indication:  Nicotine Addiction   sertraline 25 MG tablet Commonly known as:  ZOLOFT Take 1 tablet (25 mg total) by mouth daily. Start taking on:  01/29/2017  Indication:  Major Depressive Disorder   traZODone 50 MG tablet Commonly known as:  DESYREL Take 1 tablet (50 mg total) by mouth at bedtime as needed for sleep.  Indication:  Trouble Sleeping      Follow-up Information    [facility] Follow up.   Specialty:  Behavioral Health Why:  Weekday CSW will arrange appointment before discharge Contact information: [address] [place] [address] [phone]           Follow-up recommendations:  Activity:  as tolerated Diet:  heart healthy  Comments:  Take all medications as prescribed. Keep all follow-up appointments as scheduled.  Do not consume alcohol or use illegal drugs while on prescription medications. Report any adverse effects from your medications to your primary care provider promptly.  In the event of recurrent symptoms or worsening symptoms, call 911, a crisis hotline, or go to the nearest emergency department for evaluation.   Signed: Oneta Rack, NP 01/28/2017, 11:59 AM

## 2017-01-28 NOTE — Tx Team (Signed)
Interdisciplinary Treatment and Diagnostic Plan Update  01/28/2017 Time of Session: 0930 Salome Holmesiffany Critcher MRN: [id number]  Principal Diagnosis: Severe major depression without psychotic features [facility](HCC)  Secondary Diagnoses: Principal Problem:   Severe major depression without psychotic features (HCC) Active Problems:   Substance use disorder   Current Medications:  Current Facility-Administered Medications  Medication Dose Route Frequency Provider Last Rate Last Dose  . acetaminophen (TYLENOL) tablet 650 mg  650 mg Oral Q6H PRN Nira ConnBerry, Jason A, NP   650 mg at 01/27/17 2146  . alum & mag hydroxide-simeth (MAALOX/MYLANTA) 200-200-20 MG/5ML suspension 30 mL  30 mL Oral Q4H PRN Nira ConnBerry, Jason A, NP      . hydrOXYzine (ATARAX/VISTARIL) tablet 25 mg  25 mg Oral TID PRN Nira ConnBerry, Jason A, NP      . loperamide (IMODIUM) capsule 2-4 mg  2-4 mg Oral PRN Nira ConnBerry, Jason A, NP      . LORazepam (ATIVAN) tablet 1 mg  1 mg Oral Q6H PRN Nira ConnBerry, Jason A, NP   1 mg at 01/27/17 2146  . magnesium hydroxide (MILK OF MAGNESIA) suspension 30 mL  30 mL Oral Daily PRN Nira ConnBerry, Jason A, NP      . multivitamin with minerals tablet 1 tablet  1 tablet Oral Daily Nira ConnBerry, Jason A, NP   1 tablet at 01/27/17 0825  . naltrexone (DEPADE) tablet 50 mg  50 mg Oral Daily Izediuno, Delight OvensVincent A, MD   50 mg at 01/27/17 [id number]  . nicotine polacrilex (NICORETTE) gum 2 mg  2 mg Oral PRN Cobos, Rockey SituFernando A, MD      . ondansetron (ZOFRAN-ODT) disintegrating tablet 4 mg  4 mg Oral Q6H PRN Nira ConnBerry, Jason A, NP      . sertraline (ZOLOFT) tablet 25 mg  25 mg Oral Daily Izediuno, Delight OvensVincent A, MD   25 mg at 01/27/17 0825  . thiamine (VITAMIN B-1) tablet 100 mg  100 mg Oral Daily Nira ConnBerry, Jason A, NP   100 mg at 01/27/17 0827  . traZODone (DESYREL) tablet 50 mg  50 mg Oral QHS PRN Jackelyn PolingBerry, Jason A, NP   50 mg at 01/27/17 2146   PTA Medications: Prescriptions Prior to Admission  Medication Sig Dispense Refill Last Dose  . mupirocin ointment (BACTROBAN) 2 % Apply 1  application topically 2 (two) times daily. (Patient not taking: Reported on 01/25/2017) 22 g 0 Not Taking at Unknown time  . terbinafine (LAMISIL) 1 % cream Apply 1 application topically 2 (two) times daily. (Patient not taking: Reported on 01/25/2017) 30 g 0 Not Taking at Unknown time    Patient Stressors: Financial difficulties Occupational concerns Substance abuse  Patient Strengths: Wellsite geologistCommunication skills General fund of knowledge Motivation for treatment/growth Supportive family/friends  Treatment Modalities: Medication Management, Group therapy, Case management,  1 to 1 session with clinician, Psychoeducation, Recreational therapy.   Physician Treatment Plan for Primary Diagnosis: Severe major depression without psychotic features (HCC) Long Term Goal(s): Improvement in symptoms so as ready for discharge Improvement in symptoms so as ready for discharge   Short Term Goals: Ability to identify changes in lifestyle to reduce recurrence of condition will improve Ability to demonstrate self-control will improve Compliance with prescribed medications will improve Ability to verbalize feelings will improve Ability to disclose and discuss suicidal ideas Ability to demonstrate self-control will improve Ability to identify and develop effective coping behaviors will improve Compliance with prescribed medications will improve  Medication Management: Evaluate patient's response, side effects, and tolerance of medication regimen.  Therapeutic Interventions: 1 to  1 sessions, Unit Group sessions and Medication administration.  Evaluation of Outcomes: Adequate for Discharge  Physician Treatment Plan for Secondary Diagnosis: Principal Problem:   Severe major depression without psychotic features (HCC) Active Problems:   Substance use disorder  Long Term Goal(s): Improvement in symptoms so as ready for discharge Improvement in symptoms so as ready for discharge   Short Term Goals:  Ability to identify changes in lifestyle to reduce recurrence of condition will improve Ability to demonstrate self-control will improve Compliance with prescribed medications will improve Ability to verbalize feelings will improve Ability to disclose and discuss suicidal ideas Ability to demonstrate self-control will improve Ability to identify and develop effective coping behaviors will improve Compliance with prescribed medications will improve     Medication Management: Evaluate patient's response, side effects, and tolerance of medication regimen.  Therapeutic Interventions: 1 to 1 sessions, Unit Group sessions and Medication administration.  Evaluation of Outcomes: Adequate for Discharge   RN Treatment Plan for Primary Diagnosis: Severe major depression without psychotic features (HCC) Long Term Goal(s): Knowledge of disease and therapeutic regimen to maintain health will improve  Short Term Goals: Ability to remain free from injury will improve, Ability to verbalize feelings will improve and Ability to disclose and discuss suicidal ideas  Medication Management: RN will administer medications as ordered by provider, will assess and evaluate patient's response and provide education to patient for prescribed medication. RN will report any adverse and/or side effects to prescribing provider.  Therapeutic Interventions: 1 on 1 counseling sessions, Psychoeducation, Medication administration, Evaluate responses to treatment, Monitor vital signs and CBGs as ordered, Perform/monitor CIWA, COWS, AIMS and Fall Risk screenings as ordered, Perform wound care treatments as ordered.  Evaluation of Outcomes: Adequate for Discharge   LCSW Treatment Plan for Primary Diagnosis: Severe major depression without psychotic features (HCC) Long Term Goal(s): Safe transition to appropriate next level of care at discharge, Engage patient in therapeutic group addressing interpersonal concerns.  Short Term  Goals: Engage patient in aftercare planning with referrals and resources, Facilitate patient progression through stages of change regarding substance use diagnoses and concerns and Identify triggers associated with mental health/substance abuse issues  Therapeutic Interventions: Assess for all discharge needs, 1 to 1 time with Social worker, Explore available resources and support systems, Assess for adequacy in community support network, Educate family and significant other(s) on suicide prevention, Complete Psychosocial Assessment, Interpersonal group therapy.  Evaluation of Outcomes: Adequate for Discharge   Progress in Treatment: Attending groups: Yes. Participating in groups: Yes. Taking medication as prescribed: Yes. Toleration medication: Yes. Family/Significant other contact made: SPE completed with pt; pt declined to consent to family contact.  Patient understands diagnosis: Yes. Discussing patient identified problems/goals with staff: Yes. Medical problems stabilized or resolved: Yes. Denies suicidal/homicidal ideation: Yes. Issues/concerns per patient self-inventory: No. Other: n/a   New problem(s) identified: No, Describe:  n/a  New Short Term/Long Term Goal(s): elimination of SI thoughts; medication stabilization; development of comprehensive mental wellness/sobriety plan.   Discharge Plan or Barriers: Pt plans to return home and would like a return to work note. Pt plans to follow-up at [facility]; AA/NA list for [facility] provided and [facility] pamphlet provided to pt.   Reason for Continuation of Hospitalization: none  Estimated Length of Stay: 01/28/17 discharge (pt signed 72 hour request for d/c on 01/25/17).   Attendees: Patient: 01/28/2017 8:32 AM  Physician: Dr. Jackquline Berlin MD 01/28/2017 8:32 AM  Nursing: Marcelino Duster RN; [place] RN 01/28/2017 8:32 AM  RN Care Manager: Victorino Dike  [facility] 01/28/2017 8:32 AM  Social Worker: [employer], LCSW 01/28/2017 8:32 AM  Recreational  Therapist: x 01/28/2017 8:32 AM  Other: Hillery Jacks NP 01/28/2017 8:32 AM  Other:  01/28/2017 8:32 AM  Other: 01/28/2017 8:32 AM    Scribe for Treatment Team: Ledell Peoples Smart, LCSW 01/28/2017 8:32 AM

## 2017-01-28 NOTE — Tx Team (Signed)
Interdisciplinary Treatment and Diagnostic Plan Update  01/28/2017 Time of Session: 0930 Heidi Molina MRN: [id number]  Principal Diagnosis: Severe major depression without psychotic features [facility])  Secondary Diagnoses: Principal Problem:   Severe major depression without psychotic features (HCC) Active Problems:   Substance use disorder   Current Medications:  Current Facility-Administered Medications  Medication Dose Route Frequency Provider Last Rate Last Dose  . acetaminophen (TYLENOL) tablet 650 mg  650 mg Oral Q6H PRN Nira Conn A, NP   650 mg at 01/27/17 2146  . alum & mag hydroxide-simeth (MAALOX/MYLANTA) 200-200-20 MG/5ML suspension 30 mL  30 mL Oral Q4H PRN Nira Conn A, NP      . hydrOXYzine (ATARAX/VISTARIL) tablet 25 mg  25 mg Oral TID PRN Nira Conn A, NP      . loperamide (IMODIUM) capsule 2-4 mg  2-4 mg Oral PRN Nira Conn A, NP      . LORazepam (ATIVAN) tablet 1 mg  1 mg Oral Q6H PRN Nira Conn A, NP   1 mg at 01/27/17 2146  . magnesium hydroxide (MILK OF MAGNESIA) suspension 30 mL  30 mL Oral Daily PRN Nira Conn A, NP      . multivitamin with minerals tablet 1 tablet  1 tablet Oral Daily Nira Conn A, NP   1 tablet at 01/28/17 0858  . naltrexone (DEPADE) tablet 50 mg  50 mg Oral Daily Izediuno, Delight Ovens, MD   50 mg at 01/28/17 0858  . nicotine polacrilex (NICORETTE) gum 2 mg  2 mg Oral PRN Cobos, Rockey Situ, MD      . ondansetron (ZOFRAN-ODT) disintegrating tablet 4 mg  4 mg Oral Q6H PRN Nira Conn A, NP      . sertraline (ZOLOFT) tablet 25 mg  25 mg Oral Daily Izediuno, Delight Ovens, MD   25 mg at 01/28/17 0858  . thiamine (VITAMIN B-1) tablet 100 mg  100 mg Oral Daily Nira Conn A, NP   100 mg at 01/28/17 0900  . traZODone (DESYREL) tablet 50 mg  50 mg Oral QHS PRN Jackelyn Poling, NP   50 mg at 01/27/17 2146   PTA Medications: Prescriptions Prior to Admission  Medication Sig Dispense Refill Last Dose  . mupirocin ointment (BACTROBAN) 2 % Apply 1  application topically 2 (two) times daily. (Patient not taking: Reported on 01/25/2017) 22 g 0 Not Taking at Unknown time  . terbinafine (LAMISIL) 1 % cream Apply 1 application topically 2 (two) times daily. (Patient not taking: Reported on 01/25/2017) 30 g 0 Not Taking at Unknown time    Patient Stressors: Financial difficulties Occupational concerns Substance abuse  Patient Strengths: Wellsite geologist fund of knowledge Motivation for treatment/growth Supportive family/friends  Treatment Modalities: Medication Management, Group therapy, Case management,  1 to 1 session with clinician, Psychoeducation, Recreational therapy.   Physician Treatment Plan for Primary Diagnosis: Severe major depression without psychotic features (HCC) Long Term Goal(s): Improvement in symptoms so as ready for discharge Improvement in symptoms so as ready for discharge   Short Term Goals: Ability to identify changes in lifestyle to reduce recurrence of condition will improve Ability to demonstrate self-control will improve Compliance with prescribed medications will improve Ability to verbalize feelings will improve Ability to disclose and discuss suicidal ideas Ability to demonstrate self-control will improve Ability to identify and develop effective coping behaviors will improve Compliance with prescribed medications will improve  Medication Management: Evaluate patient's response, side effects, and tolerance of medication regimen.  Therapeutic Interventions: 1 to  1 sessions, Unit Group sessions and Medication administration.  Evaluation of Outcomes: Adequate for Discharge  Physician Treatment Plan for Secondary Diagnosis: Principal Problem:   Severe major depression without psychotic features (HCC) Active Problems:   Substance use disorder  Long Term Goal(s): Improvement in symptoms so as ready for discharge Improvement in symptoms so as ready for discharge   Short Term Goals:  Ability to identify changes in lifestyle to reduce recurrence of condition will improve Ability to demonstrate self-control will improve Compliance with prescribed medications will improve Ability to verbalize feelings will improve Ability to disclose and discuss suicidal ideas Ability to demonstrate self-control will improve Ability to identify and develop effective coping behaviors will improve Compliance with prescribed medications will improve     Medication Management: Evaluate patient's response, side effects, and tolerance of medication regimen.  Therapeutic Interventions: 1 to 1 sessions, Unit Group sessions and Medication administration.  Evaluation of Outcomes: Adequate for Discharge   RN Treatment Plan for Primary Diagnosis: Severe major depression without psychotic features (HCC) Long Term Goal(s): Knowledge of disease and therapeutic regimen to maintain health will improve  Short Term Goals: Ability to remain free from injury will improve, Ability to verbalize feelings will improve and Ability to disclose and discuss suicidal ideas  Medication Management: RN will administer medications as ordered by provider, will assess and evaluate patient's response and provide education to patient for prescribed medication. RN will report any adverse and/or side effects to prescribing provider.  Therapeutic Interventions: 1 on 1 counseling sessions, Psychoeducation, Medication administration, Evaluate responses to treatment, Monitor vital signs and CBGs as ordered, Perform/monitor CIWA, COWS, AIMS and Fall Risk screenings as ordered, Perform wound care treatments as ordered.  Evaluation of Outcomes: Adequate for Discharge   LCSW Treatment Plan for Primary Diagnosis: Severe major depression without psychotic features (HCC) Long Term Goal(s): Safe transition to appropriate next level of care at discharge, Engage patient in therapeutic group addressing interpersonal concerns.  Short Term  Goals: Engage patient in aftercare planning with referrals and resources, Facilitate patient progression through stages of change regarding substance use diagnoses and concerns and Identify triggers associated with mental health/substance abuse issues  Therapeutic Interventions: Assess for all discharge needs, 1 to 1 time with Social worker, Explore available resources and support systems, Assess for adequacy in community support network, Educate family and significant other(s) on suicide prevention, Complete Psychosocial Assessment, Interpersonal group therapy.  Evaluation of Outcomes: Adequate for Discharge   Progress in Treatment: Attending groups: Yes. Participating in groups: Yes. Taking medication as prescribed: Yes. Toleration medication: Yes. Family/Significant other contact made: SPE completed with pt; pt declined to consent to family contact.  Patient understands diagnosis: Yes. Discussing patient identified problems/goals with staff: Yes. Medical problems stabilized or resolved: Yes. Denies suicidal/homicidal ideation: Yes. Issues/concerns per patient self-inventory: No. Other: n/a   New problem(s) identified: No, Describe:  n/a  New Short Term/Long Term Goal(s): elimination of SI thoughts; medication stabilization; development of comprehensive mental wellness/sobriety plan.   Discharge Plan or Barriers: Pt plans to return home and would like a return to work note. Pt plans to follow-up at [facility]; AA/NA list for [facility] provided and [facility] pamphlet provided to pt.   Reason for Continuation of Hospitalization: none  Estimated Length of Stay: 01/28/17 discharge (pt signed 72 hour request for d/c on 01/25/17).   Attendees: Patient: 01/28/2017 11:26 AM  Physician: Dr. Jackquline Berlin MD 01/28/2017 11:26 AM  Nursing: Marcelino Duster RN; Gordon RN 01/28/2017 11:26 AM  RN Care Manager: Victorino Dike  [facility] 01/28/2017 11:26 AM  Social Worker: [employer] Sherwin-WilliamsHeather Smart, LCSW 01/28/2017 11:26 AM   Recreational Therapist: x 01/28/2017 11:26 AM  Other: Hillery Jacksanika Lewis NP 01/28/2017 11:26 AM  Other:  01/28/2017 11:26 AM  Other: 01/28/2017 11:26 AM    Scribe for Treatment Team: Ledell PeoplesHeather N Smart, LCSW 01/28/2017 11:26 AM

## 2017-04-01 ENCOUNTER — Encounter (HOSPITAL_COMMUNITY): Payer: Self-pay | Admitting: Nurse Practitioner

## 2017-04-01 ENCOUNTER — Emergency Department (HOSPITAL_COMMUNITY)
Admission: EM | Admit: 2017-04-01 | Discharge: 2017-04-02 | Disposition: A | Discharge status: Home / Self Care | Payer: Self-pay | Attending: Emergency Medicine | Admitting: Emergency Medicine

## 2017-04-01 DIAGNOSIS — M5442 Lumbago with sciatica, left side: Secondary | ICD-10-CM | POA: Insufficient documentation

## 2017-04-01 DIAGNOSIS — R809 Proteinuria, unspecified: Secondary | ICD-10-CM

## 2017-04-01 DIAGNOSIS — F17210 Nicotine dependence, cigarettes, uncomplicated: Secondary | ICD-10-CM | POA: Insufficient documentation

## 2017-04-01 NOTE — ED Triage Notes (Signed)
Pt presents with c/o left leg pain. The pain began about three days ago. The pain is a tingling, numbness that radiates from her hip to her foot and her leg feels tender when she touches it. She denies any injuries, weakness, back pain, edema. She has also noticed a palpable lump in her left groin under her skin.

## 2017-04-01 NOTE — ED Provider Notes (Signed)
[facility]-EMERGENCY DEPT Provider Note   CSN: [id number] Arrival date & time: 04/01/17  1601     History   Chief Complaint Chief Complaint  Patient presents with  . Leg Pain    HPI Heidi Molina is a 36 y.o. female with a hx of depression and substance abuse presents to the Emergency Department complaining of gradual, persistent, progressively worsening low back and flank pain radiating to the left leg onset 3 mos ago but worsening in the last 3 days.  Pt denies saddle anesthesia, bowel or bladder incontinence. Associated symptoms include paresthesias and "soreness" in the left leg.  Pt denies known fall or trauma.  Pt also c/o inguinal lymph node but denies fever, chills, abd pain, N/V/D.  Patient reports taking Goody powders without relief. She denies recent travel, leg swelling, history of DVT, estrogen usage, surgery or fracture. Nothing specifically makes her pain better or worse. Patient denies all urinary or vaginal symptoms.   The history is provided by the patient and medical records. No language interpreter was used.    Past Medical History:  Diagnosis Date  . Depression     Patient Active Problem List   Diagnosis Date Noted  . Substance use disorder 01/26/2017  . 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 APPENDECTOMY    . TUBAL LIGATION      OB History    No data available       Home Medications    Prior to Admission medications   Medication Sig Start Date End Date Taking? Authorizing Provider  hydrOXYzine (ATARAX/VISTARIL) 25 MG tablet Take 1 tablet (25 mg total) by mouth 3 (three) times daily as needed for anxiety. Patient not taking: Reported on 04/01/2017 01/28/17   Oneta Rack, NP  methocarbamol (ROBAXIN) 500 MG tablet Take 1 tablet (500 mg total) by mouth 2 (two) times daily. 04/02/17   Kamrynn Melott, Dahlia Client, PA-C  naltrexone (DEPADE) 50 MG tablet Take 1 tablet (50 mg  total) by mouth daily. Patient not taking: Reported on 04/01/2017 01/29/17   Oneta Rack, NP  naproxen (NAPROSYN) 500 MG tablet Take 1 tablet (500 mg total) by mouth 2 (two) times daily with a meal. 04/02/17   Vernel Donlan, Dahlia Client, PA-C  nicotine polacrilex (NICORETTE) 2 MG gum Take 1 each (2 mg total) by mouth as needed for smoking cessation. Patient not taking: Reported on 04/01/2017 01/28/17   Oneta Rack, NP  predniSONE (DELTASONE) 20 MG tablet 3 tabs po daily x 3 days, then 2 tabs x 3 days, then 1.5 tabs x 3 days, then 1 tab x 3 days, then 0.5 tabs x 3 days 04/02/17   Ita Fritzsche, Dahlia Client, PA-C  sertraline (ZOLOFT) 25 MG tablet Take 1 tablet (25 mg total) by mouth daily. Patient not taking: Reported on 04/01/2017 01/29/17   Oneta Rack, NP  traZODone (DESYREL) 50 MG tablet Take 1 tablet (50 mg total) by mouth at bedtime as needed for sleep. Patient not taking: Reported on 04/01/2017 01/28/17   Oneta Rack, NP    Family History No family history on file.  Social History Social History  Substance Use Topics  . Smoking status: Current Every Day Smoker    Packs/day: 1.00    Types: Cigarettes  . Smokeless tobacco: Never Used  . Alcohol use Yes     Comment: occasional      Allergies   Patient has no known allergies.   Review  of Systems Review of Systems  Genitourinary: Positive for flank pain. Negative for dysuria, hematuria, urgency, vaginal bleeding and vaginal discharge.  Musculoskeletal: Positive for arthralgias, back pain and myalgias.  Neurological: Positive for numbness ( paresthesias).  All other systems reviewed and are negative.    Physical Exam Updated Vital Signs BP (!) 139/91 (BP Location: Right Arm)   Pulse 72   Temp 98.4 F (36.9 C) (Oral)   Resp 18   SpO2 100%   Physical Exam  Constitutional: She appears well-developed and well-nourished. No distress.  HENT:  Head: Normocephalic and atraumatic.  Mouth/Throat: Oropharynx is clear and moist. No  oropharyngeal exudate.  Eyes: Conjunctivae are normal.  Neck: Normal range of motion. Neck supple.  Full ROM without pain  Cardiovascular: Normal rate, regular rhythm and intact distal pulses.   Pulmonary/Chest: Effort normal and breath sounds normal. No respiratory distress. She has no wheezes.  Abdominal: Soft. She exhibits no distension. There is no tenderness. There is no rigidity, no rebound, no guarding and no CVA tenderness.  Musculoskeletal:  Full range of motion of the T-spine and L-spine No midline tenderness to the  T-spine or L-spine Tenderness to palpation of the left paraspinous muscles of the L-spine FROM of the bilateral hips, knees, ankles and toes  Lymphadenopathy:    She has no cervical adenopathy.  Neurological: She is alert.  Speech is clear and goal oriented, follows commands Normal 5/5 strength in upper and lower extremities bilaterally including dorsiflexion and plantar flexion, strong and equal grip strength Sensation normal to light and sharp touch Moves extremities without ataxia, coordination intact Normal gait Normal balance No Clonus  Skin: Skin is warm and dry. No rash noted. She is not diaphoretic. No erythema.  Psychiatric: She has a normal mood and affect. Her behavior is normal.  Nursing note and vitals reviewed.    ED Treatments / Results  Labs (all labs ordered are listed, but only abnormal results are displayed) Labs Reviewed  URINALYSIS, ROUTINE W REFLEX MICROSCOPIC - Abnormal; Notable for the following:       Result Value   APPearance HAZY (*)    Protein, ur 30 (*)    Squamous Epithelial / LPF 0-5 (*)    All other components within normal limits  I-STAT CHEM 8, ED - Abnormal; Notable for the following:    Glucose, Bld 121 (*)    Calcium, Ion 1.12 (*)    All other components within normal limits  POC URINE PREG, ED    Radiology Dg Lumbar Spine Complete  Result Date: 04/02/2017 CLINICAL DATA:  Left lower back pain for several days.  EXAM: LUMBAR SPINE - COMPLETE 4+ VIEW COMPARISON:  None. FINDINGS: The lumbar vertebrae are normal in height. No spondylolysis or spondylolisthesis. Excellent preservation of intervertebral disc spaces. Facet articulations are unremarkable. Sacroiliac joints are unremarkable. IMPRESSION: Negative. Electronically Signed   By: Ellery Plunk M.D.   On: 04/02/2017 02:35    Procedures Procedures (including critical care time)  Medications Ordered in ED Medications  methocarbamol (ROBAXIN) tablet 500 mg (500 mg Oral Given 04/02/17 0107)  naproxen (NAPROSYN) tablet 500 mg (500 mg Oral Given 04/02/17 0107)     Initial Impression / Assessment and Plan / ED Course  I have reviewed the triage vital signs and the nursing notes.  Pertinent labs & imaging results that were available during my care of the patient were reviewed by me and considered in my medical decision making (see chart for details).  Normal neurological exam, no evidence of urinary incontinence or retention. There is no evidence of AAA or concern for dissection at this time.   Patient can walk With steady gait but states it is painful.  No loss of bowel or bladder control.  No concern for cauda equina.  No fever, night sweats, weight loss, h/o cancer, IVDU.  Plain films without acute abnormality. Patient also with some flank pain but no CVA tenderness. Minimal proteinuria noted but no hematuria or elevated serum creatinine. Pain is not consistent with nephrolithiasis or renal colic. Pain treated here in the department with adequate improvement. RICE protocol and pain medicine indicated and discussed with patient. I have also discussed reasons to return immediately to the ER.  Patient expresses understanding and agrees with plan.    Final Clinical Impressions(s) / ED Diagnoses   Final diagnoses:  Proteinuria, unspecified type  Acute left-sided low back pain with left-sided sciatica    New Prescriptions Discharge Medication  List as of 04/02/2017  3:00 AM    START taking these medications   Details  methocarbamol (ROBAXIN) 500 MG tablet Take 1 tablet (500 mg total) by mouth 2 (two) times daily., Starting Tue 04/02/2017, Print    naproxen (NAPROSYN) 500 MG tablet Take 1 tablet (500 mg total) by mouth 2 (two) times daily with a meal., Starting Tue 04/02/2017, Print    predniSONE (DELTASONE) 20 MG tablet 3 tabs po daily x 3 days, then 2 tabs x 3 days, then 1.5 tabs x 3 days, then 1 tab x 3 days, then 0.5 tabs x 3 days, Print         Milta Deiters 04/02/17 0324    Nira Conn, MD 04/03/17 [id number]

## 2017-04-02 ENCOUNTER — Ambulatory Visit (HOSPITAL_COMMUNITY): Admission: RE | Admit: 2017-04-02 | Payer: Medicaid Other | Source: Ambulatory Visit

## 2017-04-02 ENCOUNTER — Emergency Department (HOSPITAL_COMMUNITY): Payer: Self-pay

## 2017-04-02 LAB — POC URINE PREG, ED: Preg Test, Ur: NEGATIVE

## 2017-04-02 LAB — I-STAT CHEM 8, ED
BUN: 12 mg/dL (ref 6–20)
CREATININE: 0.7 mg/dL (ref 0.44–1.00)
Calcium, Ion: 1.12 mmol/L — ABNORMAL LOW (ref 1.15–1.40)
Chloride: 104 mmol/L (ref 101–111)
GLUCOSE: 121 mg/dL — AB (ref 65–99)
HCT: 38 % (ref 36.0–46.0)
Hemoglobin: 12.9 g/dL (ref 12.0–15.0)
POTASSIUM: 3.7 mmol/L (ref 3.5–5.1)
Sodium: 138 mmol/L (ref 135–145)
TCO2: 24 mmol/L (ref 22–32)

## 2017-04-02 LAB — URINALYSIS, ROUTINE W REFLEX MICROSCOPIC
BILIRUBIN URINE: NEGATIVE
Bacteria, UA: NONE SEEN
GLUCOSE, UA: NEGATIVE mg/dL
Hgb urine dipstick: NEGATIVE
KETONES UR: NEGATIVE mg/dL
LEUKOCYTES UA: NEGATIVE
Nitrite: NEGATIVE
PH: 6 (ref 5.0–8.0)
Protein, ur: 30 mg/dL — AB
Specific Gravity, Urine: 1.029 (ref 1.005–1.030)

## 2017-04-02 MED ORDER — NAPROXEN 250 MG PO TABS
500.0000 mg | ORAL_TABLET | Freq: Once | ORAL | Status: AC
  Administered 2017-04-02: 500 mg via ORAL
  Filled 2017-04-02: qty 2

## 2017-04-02 MED ORDER — METHOCARBAMOL 500 MG PO TABS: 500.0000 mg | ORAL_TABLET | Freq: Two times a day (BID) | ORAL | 0 refills | Status: DC

## 2017-04-02 MED ORDER — NAPROXEN 500 MG PO TABS: 500.0000 mg | ORAL_TABLET | Freq: Two times a day (BID) | ORAL | 0 refills | Status: DC

## 2017-04-02 MED ORDER — PREDNISONE 20 MG PO TABS: ORAL_TABLET | ORAL | 0 refills | Status: DC

## 2017-04-02 MED ORDER — METHOCARBAMOL 500 MG PO TABS
500.0000 mg | ORAL_TABLET | Freq: Once | ORAL | Status: AC
  Administered 2017-04-02: 500 mg via ORAL
  Filled 2017-04-02: qty 1

## 2017-04-02 NOTE — Discharge Instructions (Signed)
1. Medications: robaxin, naproxyn, prednisone, usual home medications 2. Treatment: rest, drink plenty of fluids, gentle stretching as discussed, alternate ice and heat 3. Follow Up: Please followup with your primary doctor in 3 days for discussion of your diagnoses and further evaluation after today's visit; if you do not have a primary care doctor use the resource guide provided to find one;  Return to the ER for worsening back pain, difficulty walking, loss of bowel or bladder control or other concerning symptoms    

## 2017-04-02 NOTE — ED Notes (Signed)
Patient Alert and oriented X4. Stable and ambulatory. Patient verbalized understanding of the discharge instructions.  Patient belongings were taken by the patient.  

## 2017-04-03 ENCOUNTER — Ambulatory Visit (HOSPITAL_COMMUNITY)
Admission: RE | Admit: 2017-04-03 | Discharge: 2017-04-03 | Disposition: A | Discharge status: Home / Self Care | Payer: Self-pay | Source: Ambulatory Visit | Attending: Physician Assistant | Admitting: Physician Assistant

## 2017-04-03 DIAGNOSIS — M79609 Pain in unspecified limb: Secondary | ICD-10-CM

## 2017-04-03 DIAGNOSIS — M7989 Other specified soft tissue disorders: Secondary | ICD-10-CM

## 2017-04-03 DIAGNOSIS — M79605 Pain in left leg: Secondary | ICD-10-CM | POA: Insufficient documentation

## 2017-04-03 NOTE — Progress Notes (Signed)
*  PRELIMINARY RESULTS* Vascular Ultrasound Left lower extremity venous duplex has been completed.  Preliminary findings: No evidence of DVT or baker's cyst.    Farrel DemarkJill Eunice, RDMS, RVT  04/03/2017, 10:17 AM

## 2017-07-23 ENCOUNTER — Emergency Department (HOSPITAL_COMMUNITY)
Admission: EM | Admit: 2017-07-23 | Discharge: 2017-07-23 | Disposition: A | Discharge status: Home / Self Care | Payer: No Typology Code available for payment source | Attending: Emergency Medicine | Admitting: Emergency Medicine

## 2017-07-23 ENCOUNTER — Encounter (HOSPITAL_COMMUNITY): Payer: Self-pay

## 2017-07-23 ENCOUNTER — Other Ambulatory Visit: Payer: Self-pay

## 2017-07-23 DIAGNOSIS — S161XXA Strain of muscle, fascia and tendon at neck level, initial encounter: Secondary | ICD-10-CM

## 2017-07-23 DIAGNOSIS — F17210 Nicotine dependence, cigarettes, uncomplicated: Secondary | ICD-10-CM | POA: Insufficient documentation

## 2017-07-23 DIAGNOSIS — Y939 Activity, unspecified: Secondary | ICD-10-CM | POA: Diagnosis not present

## 2017-07-23 DIAGNOSIS — Y929 Unspecified place or not applicable: Secondary | ICD-10-CM | POA: Insufficient documentation

## 2017-07-23 DIAGNOSIS — Y999 Unspecified external cause status: Secondary | ICD-10-CM | POA: Diagnosis not present

## 2017-07-23 DIAGNOSIS — R030 Elevated blood-pressure reading, without diagnosis of hypertension: Secondary | ICD-10-CM | POA: Diagnosis not present

## 2017-07-23 DIAGNOSIS — Z041 Encounter for examination and observation following transport accident: Secondary | ICD-10-CM | POA: Diagnosis present

## 2017-07-23 MED ORDER — CYCLOBENZAPRINE HCL 10 MG PO TABS: 10.0000 mg | ORAL_TABLET | Freq: Two times a day (BID) | ORAL | 0 refills | Status: DC | PRN

## 2017-07-23 MED ORDER — CYCLOBENZAPRINE HCL 10 MG PO TABS
10.0000 mg | ORAL_TABLET | Freq: Once | ORAL | Status: AC
  Administered 2017-07-23: 10 mg via ORAL
  Filled 2017-07-23: qty 1

## 2017-07-23 MED ORDER — IBUPROFEN 600 MG PO TABS: 600.0000 mg | ORAL_TABLET | Freq: Four times a day (QID) | ORAL | 0 refills | Status: DC | PRN

## 2017-07-23 MED ORDER — IBUPROFEN 200 MG PO TABS
600.0000 mg | ORAL_TABLET | Freq: Once | ORAL | Status: AC
  Administered 2017-07-23: 600 mg via ORAL
  Filled 2017-07-23: qty 3
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
## 2017-07-23 NOTE — ED Notes (Signed)
Pt is alert and oriented x 4 and is verbally responsive. Pt report 7/10 neck and back pain. Pt reports slight HA denies N/V/ no visual changes.

## 2018-01-16 ENCOUNTER — Encounter (HOSPITAL_COMMUNITY): Payer: Self-pay | Admitting: Emergency Medicine

## 2018-01-16 ENCOUNTER — Emergency Department (HOSPITAL_COMMUNITY)
Admission: EM | Admit: 2018-01-16 | Discharge: 2018-01-16 | Disposition: A | Discharge status: Home / Self Care | Payer: Self-pay | Attending: Emergency Medicine | Admitting: Emergency Medicine

## 2018-01-16 ENCOUNTER — Emergency Department (HOSPITAL_COMMUNITY): Payer: Self-pay

## 2018-01-16 ENCOUNTER — Other Ambulatory Visit: Payer: Self-pay

## 2018-01-16 DIAGNOSIS — S41111A Laceration without foreign body of right upper arm, initial encounter: Secondary | ICD-10-CM | POA: Insufficient documentation

## 2018-01-16 DIAGNOSIS — F17210 Nicotine dependence, cigarettes, uncomplicated: Secondary | ICD-10-CM | POA: Insufficient documentation

## 2018-01-16 DIAGNOSIS — Y939 Activity, unspecified: Secondary | ICD-10-CM | POA: Insufficient documentation

## 2018-01-16 DIAGNOSIS — Y999 Unspecified external cause status: Secondary | ICD-10-CM | POA: Insufficient documentation

## 2018-01-16 DIAGNOSIS — W25XXXA Contact with sharp glass, initial encounter: Secondary | ICD-10-CM | POA: Insufficient documentation

## 2018-01-16 DIAGNOSIS — Y929 Unspecified place or not applicable: Secondary | ICD-10-CM | POA: Insufficient documentation

## 2018-01-16 DIAGNOSIS — Z79899 Other long term (current) drug therapy: Secondary | ICD-10-CM | POA: Insufficient documentation

## 2018-01-16 DIAGNOSIS — Z23 Encounter for immunization: Secondary | ICD-10-CM | POA: Insufficient documentation

## 2018-01-16 MED ORDER — LIDOCAINE-EPINEPHRINE (PF) 2 %-1:200000 IJ SOLN
10.0000 mL | Freq: Once | INTRAMUSCULAR | Status: DC
  Filled 2018-01-16: qty 20

## 2018-01-16 MED ORDER — TETANUS-DIPHTH-ACELL PERTUSSIS 5-2.5-18.5 LF-MCG/0.5 IM SUSP
0.5000 mL | Freq: Once | INTRAMUSCULAR | Status: AC
  Administered 2018-01-16: 0.5 mL via INTRAMUSCULAR
  Filled 2018-01-16: qty 0.5

## 2018-01-16 NOTE — ED Notes (Signed)
Patient transported to X-ray 

## 2018-01-16 NOTE — ED Notes (Signed)
See EDP assessment / note.   
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
## 2018-01-16 NOTE — ED Notes (Signed)
Flushing Pt's wound

## 2018-01-16 NOTE — Discharge Instructions (Signed)
Please follow up with your primary care provider within 5-7 days for re-evaluation of your symptoms. If you do not have a primary care provider, information for a healthcare clinic has been provided for you to make arrangements for follow up care.  Please return to the emergency room immediately if you experience any new or worsening symptoms or any symptoms that indicate worsening infection such as fevers, increased redness/swelling/pain, warmth, or drainage from the affected area.    

## 2019-09-07 ENCOUNTER — Encounter: Payer: Self-pay | Admitting: Family Medicine

## 2019-09-07 ENCOUNTER — Other Ambulatory Visit: Payer: Self-pay

## 2019-09-07 ENCOUNTER — Ambulatory Visit (INDEPENDENT_AMBULATORY_CARE_PROVIDER_SITE_OTHER): Payer: 59 | Admitting: Family Medicine

## 2019-09-07 VITALS — BP 140/80 | HR 61 | Ht 61.0 in | Wt 131.6 lb

## 2019-09-07 DIAGNOSIS — Z7689 Persons encountering health services in other specified circumstances: Secondary | ICD-10-CM | POA: Diagnosis not present

## 2019-09-07 DIAGNOSIS — F321 Major depressive disorder, single episode, moderate: Secondary | ICD-10-CM | POA: Diagnosis not present

## 2019-09-07 DIAGNOSIS — I10 Essential (primary) hypertension: Secondary | ICD-10-CM

## 2019-09-07 NOTE — Patient Instructions (Signed)
It was nice meeting you today.  I recommend that you keep an eye on your blood pressure over the next 4 weeks and bring in your readings and blood pressure machine to your follow-up visit. Goal blood pressure readings are less than 130/80  I also recommend that you eat a low-sodium diet.  We will be in touch with your lab results  Come in fasting for your 4-week follow-up and we can do your annual physical if you would like    DASH Eating Plan DASH stands for "Dietary Approaches to Stop Hypertension." The DASH eating plan is a healthy eating plan that has been shown to reduce high blood pressure (hypertension). It may also reduce your risk for type 2 diabetes, heart disease, and stroke. The DASH eating plan may also help with weight loss. What are tips for following this plan?  General guidelines  Avoid eating more than 2,300 mg (milligrams) of salt (sodium) a day. If you have hypertension, you may need to reduce your sodium intake to 1,500 mg a day.  Limit alcohol intake to no more than 1 drink a day for nonpregnant women and 2 drinks a day for men. One drink equals 12 oz of beer, 5 oz of wine, or 1 oz of hard liquor.  Work with your health care provider to maintain a healthy body weight or to lose weight. Ask what an ideal weight is for you.  Get at least 30 minutes of exercise that causes your heart to beat faster (aerobic exercise) most days of the week. Activities may include walking, swimming, or biking.  Work with your health care provider or diet and nutrition specialist (dietitian) to adjust your eating plan to your individual calorie needs. Reading food labels   Check food labels for the amount of sodium per serving. Choose foods with less than 5 percent of the Daily Value of sodium. Generally, foods with less than 300 mg of sodium per serving fit into this eating plan.  To find whole grains, look for the word "whole" as the first word in the ingredient  list. Shopping  Buy products labeled as "low-sodium" or "no salt added."  Buy fresh foods. Avoid canned foods and premade or frozen meals. Cooking  Avoid adding salt when cooking. Use salt-free seasonings or herbs instead of table salt or sea salt. Check with your health care provider or pharmacist before using salt substitutes.  Do not fry foods. Cook foods using healthy methods such as baking, boiling, grilling, and broiling instead.  Cook with heart-healthy oils, such as olive, canola, soybean, or sunflower oil. Meal planning  Eat a balanced diet that includes: ? 5 or more servings of fruits and vegetables each day. At each meal, try to fill half of your plate with fruits and vegetables. ? Up to 6-8 servings of whole grains each day. ? Less than 6 oz of lean meat, poultry, or fish each day. A 3-oz serving of meat is about the same size as a deck of cards. One egg equals 1 oz. ? 2 servings of low-fat dairy each day. ? A serving of nuts, seeds, or beans 5 times each week. ? Heart-healthy fats. Healthy fats called Omega-3 fatty acids are found in foods such as flaxseeds and coldwater fish, like sardines, salmon, and mackerel.  Limit how much you eat of the following: ? Canned or prepackaged foods. ? Food that is high in trans fat, such as fried foods. ? Food that is high in saturated fat, such  as fatty meat. ? Sweets, desserts, sugary drinks, and other foods with added sugar. ? Full-fat dairy products.  Do not salt foods before eating.  Try to eat at least 2 vegetarian meals each week.  Eat more home-cooked food and less restaurant, buffet, and fast food.  When eating at a restaurant, ask that your food be prepared with less salt or no salt, if possible. What foods are recommended? The items listed may not be a complete list. Talk with your dietitian about what dietary choices are best for you. Grains Whole-grain or whole-wheat bread. Whole-grain or whole-wheat pasta. Brown  rice. [place]. Bulgur. Whole-grain and low-sodium cereals. Pita bread. Low-fat, low-sodium crackers. Whole-wheat flour tortillas. Vegetables Fresh or frozen vegetables (raw, steamed, roasted, or grilled). Low-sodium or reduced-sodium tomato and vegetable juice. Low-sodium or reduced-sodium tomato sauce and tomato paste. Low-sodium or reduced-sodium canned vegetables. Fruits All fresh, dried, or frozen fruit. Canned fruit in natural juice (without added sugar). Meat and other protein foods Skinless chicken or [place]. Ground chicken or [place]. Pork with fat trimmed off. Fish and seafood. Egg whites. Dried beans, peas, or lentils. Unsalted nuts, nut butters, and seeds. Unsalted canned beans. Lean cuts of beef with fat trimmed off. Low-sodium, lean deli meat. Dairy Low-fat (1%) or fat-free (skim) milk. Fat-free, low-fat, or reduced-fat cheeses. Nonfat, low-sodium ricotta or cottage cheese. Low-fat or nonfat yogurt. Low-fat, low-sodium cheese. Fats and oils Soft margarine without trans fats. Vegetable oil. Low-fat, reduced-fat, or light mayonnaise and salad dressings (reduced-sodium). Canola, safflower, olive, soybean, and sunflower oils. Avocado. Seasoning and other foods Herbs. Spices. Seasoning mixes without salt. Unsalted popcorn and pretzels. Fat-free sweets. What foods are not recommended? The items listed may not be a complete list. Talk with your dietitian about what dietary choices are best for you. Grains Baked goods made with fat, such as croissants, muffins, or some breads. Dry pasta or rice meal packs. Vegetables Creamed or fried vegetables. Vegetables in a cheese sauce. Regular canned vegetables (not low-sodium or reduced-sodium). Regular canned tomato sauce and paste (not low-sodium or reduced-sodium). Regular tomato and vegetable juice (not low-sodium or reduced-sodium). Angie Fava. Olives. Fruits Canned fruit in a light or heavy syrup. Fried fruit. Fruit in cream or butter  sauce. Meat and other protein foods Fatty cuts of meat. Ribs. Fried meat. Berniece Salines. Sausage. Bologna and other processed lunch meats. Salami. Fatback. Hotdogs. Bratwurst. Salted nuts and seeds. Canned beans with added salt. Canned or smoked fish. Whole eggs or egg yolks. Chicken or [place] with skin. Dairy Whole or 2% milk, cream, and half-and-half. Whole or full-fat cream cheese. Whole-fat or sweetened yogurt. Full-fat cheese. Nondairy creamers. Whipped toppings. Processed cheese and cheese spreads. Fats and oils Butter. Stick margarine. Lard. Shortening. Ghee. Bacon fat. Tropical oils, such as coconut, palm kernel, or palm oil. Seasoning and other foods Salted popcorn and pretzels. Onion salt, garlic salt, seasoned salt, table salt, and sea salt. Worcestershire sauce. Tartar sauce. Barbecue sauce. Teriyaki sauce. Soy sauce, including reduced-sodium. Steak sauce. Canned and packaged gravies. Fish sauce. Oyster sauce. Cocktail sauce. Horseradish that you find on the shelf. Ketchup. Mustard. Meat flavorings and tenderizers. Bouillon cubes. Hot sauce and Tabasco sauce. Premade or packaged marinades. Premade or packaged taco seasonings. Relishes. Regular salad dressings. Where to find more information:  National Heart, Lung, and [place]: https://wilson-eaton.com/  American Heart Association: www.heart.org Summary  The DASH eating plan is a healthy eating plan that has been shown to reduce high blood pressure (hypertension). It may also reduce your risk  for type 2 diabetes, heart disease, and stroke.  With the DASH eating plan, you should limit salt (sodium) intake to 2,300 mg a day. If you have hypertension, you may need to reduce your sodium intake to 1,500 mg a day.  When on the DASH eating plan, aim to eat more fresh fruits and vegetables, whole grains, lean proteins, low-fat dairy, and heart-healthy fats.  Work with your health care provider or diet and nutrition specialist (dietitian) to adjust  your eating plan to your individual calorie needs. This information is not intended to replace advice given to you by your health care provider. Make sure you discuss any questions you have with your health care provider. Document Revised: 07/05/2017 Document Reviewed: 07/16/2016 Elsevier Patient Education  2020 [employer].

## 2019-09-07 NOTE — Progress Notes (Signed)
   Subjective:    Patient ID: Heidi Molina, female    DOB: 02-Apr-1981, 39 y.o.   MRN: [id number]  HPI Chief Complaint  Patient presents with  . new pt    new pt get established. hasn't seen doctor in about 3 years.    She is new to the practice and here to get established.  Previous medical care: no regular care in years   Other providers: OB/GYN - has appt later this week   History of HTN- states she used to be on medication. This was prescribed in the ED and she does not recall which medication it was.    Denies fever, chills, dizziness, chest pain, palpitations, shortness of breath, abdominal pain, N/V/D, urinary symptoms, LE edema.   History of depression. States her mood has been fine and has not been on medication.    Smoked cigarettes and stopped in 2019. Smokes marijuana once a week.  She drinks occasionally, 2 beers on the weekend.   Father -HTN and HL. Stroke at late 79s Mother- heart disease and died at age 72    Social history: Lives with daughter 2 kids 41 and 14, works Oceanographer and CNA   Diet: fairly healthy  Excerise: dance   Last Gynecological Exam: Last Menstrual cycle: 08/24/2019   Reviewed allergies, medications, past medical, surgical, family, and social history.   Review of Systems Pertinent positives and negatives in the history of present illness.     Objective:   Physical Exam BP 140/80   Pulse 61   Ht 5\' 1"  (1.549 m)   Wt 131 lb 9.6 oz (59.7 kg)   LMP 08/24/2019   SpO2 96%   BMI 24.87 kg/m   Alert and in no distress.  Cardiac exam shows a regular rhythm without murmurs or gallops. Lungs are clear to auscultation. Extremities without edema. Skin is warm and dry.       Assessment & Plan:  Essential hypertension - Plan: CBC with Differential/Platelet, Comprehensive metabolic panel  Depression, major, single episode, moderate (HCC)  Encounter to establish care  She is a pleasant 39 year old female who is new to the  practice and has establish care.  She has a history of hypertension and has not been on medication in quite some time.  Discussed that her blood pressure today is slightly elevated.  Counseling on healthy lifestyle to help lower blood pressure including low-sodium diet, exercise and stopping smoking.  She smokes marijuana occasionally. Encouraged her to check her blood pressure outside of year over the next 4 weeks and return with her readings and blood pressure machine.  We will determine if she needs to be on medication again.  DASH diet handout provided. She reports not having issues with depression currently. She may follow-up for a fasting CPE in 4 weeks as well as hypertension follow-up

## 2019-09-08 LAB — COMPREHENSIVE METABOLIC PANEL
ALT: 15 IU/L (ref 0–32)
AST: 19 IU/L (ref 0–40)
Albumin/Globulin Ratio: 1.6 (ref 1.2–2.2)
Albumin: 4.2 g/dL (ref 3.8–4.8)
Alkaline Phosphatase: 78 IU/L (ref 39–117)
BUN/Creatinine Ratio: 12 (ref 9–23)
BUN: 9 mg/dL (ref 6–24)
Bilirubin Total: 0.3 mg/dL (ref 0.0–1.2)
CO2: 21 mmol/L (ref 20–29)
Calcium: 9.3 mg/dL (ref 8.7–10.2)
Chloride: 105 mmol/L (ref 96–106)
Creatinine, Ser: 0.76 mg/dL (ref 0.57–1.00)
GFR calc Af Amer: 113 mL/min/{1.73_m2} (ref >59)
GFR calc non Af Amer: 98 mL/min/{1.73_m2} (ref >59)
Globulin, Total: 2.6 g/dL (ref 1.5–4.5)
Glucose: 82 mg/dL (ref 65–99)
Potassium: 4.2 mmol/L (ref 3.5–5.2)
Sodium: 141 mmol/L (ref 134–144)
Total Protein: 6.8 g/dL (ref 6.0–8.5)

## 2019-09-08 LAB — CBC WITH DIFFERENTIAL/PLATELET
Basophils Absolute: 0.1 10*3/uL (ref 0.0–0.2)
Basos: 1 %
EOS (ABSOLUTE): 0.1 10*3/uL (ref 0.0–0.4)
Eos: 1 %
Hematocrit: 38.1 % (ref 34.0–46.6)
Hemoglobin: 13 g/dL (ref 11.1–15.9)
Immature Grans (Abs): 0 10*3/uL (ref 0.0–0.1)
Immature Granulocytes: 0 %
Lymphocytes Absolute: 2.5 10*3/uL (ref 0.7–3.1)
Lymphs: 38 %
MCH: 30.4 pg (ref 26.6–33.0)
MCHC: 34.1 g/dL (ref 31.5–35.7)
MCV: 89 fL (ref 79–97)
Monocytes Absolute: 0.5 10*3/uL (ref 0.1–0.9)
Monocytes: 8 %
Neutrophils Absolute: 3.5 10*3/uL (ref 1.4–7.0)
Neutrophils: 52 %
Platelets: 406 10*3/uL (ref 150–450)
RBC: 4.28 x10E6/uL (ref 3.77–5.28)
RDW: 12.4 % (ref 11.7–15.4)
WBC: 6.6 10*3/uL (ref 3.4–10.8)

## 2019-09-10 ENCOUNTER — Encounter: Payer: Self-pay | Admitting: Advanced Practice Midwife

## 2019-09-10 ENCOUNTER — Other Ambulatory Visit (HOSPITAL_COMMUNITY)
Admission: RE | Admit: 2019-09-10 | Discharge: 2019-09-10 | Disposition: A | Discharge status: Home / Self Care | Payer: 59 | Source: Ambulatory Visit | Attending: Advanced Practice Midwife | Admitting: Advanced Practice Midwife

## 2019-09-10 ENCOUNTER — Other Ambulatory Visit: Payer: Self-pay

## 2019-09-10 ENCOUNTER — Ambulatory Visit (INDEPENDENT_AMBULATORY_CARE_PROVIDER_SITE_OTHER): Payer: 59 | Admitting: Advanced Practice Midwife

## 2019-09-10 VITALS — BP 163/109 | HR 92 | Ht 60.0 in | Wt 129.4 lb

## 2019-09-10 DIAGNOSIS — I10 Essential (primary) hypertension: Secondary | ICD-10-CM

## 2019-09-10 DIAGNOSIS — Z87891 Personal history of nicotine dependence: Secondary | ICD-10-CM | POA: Diagnosis not present

## 2019-09-10 DIAGNOSIS — F1990 Other psychoactive substance use, unspecified, uncomplicated: Secondary | ICD-10-CM

## 2019-09-10 DIAGNOSIS — Z01419 Encounter for gynecological examination (general) (routine) without abnormal findings: Secondary | ICD-10-CM

## 2019-09-10 NOTE — Progress Notes (Signed)
GYNECOLOGY ANNUAL PREVENTATIVE CARE ENCOUNTER NOTE  History:     Heidi Molina is a 39 y.o. G47P1002 female here for a routine annual gynecologic exam.  Current complaints: N/A.   Denies abnormal vaginal bleeding, discharge, pelvic pain, problems with intercourse or other gynecologic concerns.    Patient lives with her boyfriend of 16 years and her son. She is a former cigarette smoker, quit one year ago. She has been sober for three days and feels positive about her success.   Gynecologic History Patient's last menstrual period was 08/24/2019. Contraception: tubal ligation Last Pap: Remote. Results were: normal with negative HPV Last mammogram: No previous mammograms  Obstetric History OB History  Gravida Para Term Preterm AB Living  2 1 1     2   SAB TAB Ectopic Multiple Live Births          2    # Outcome Date GA Lbr Len/2nd Weight Sex Delivery Anes PTL Lv  2 Term      CS-LTranv     1 Gravida      CS-LTranv       Past Medical History:  Diagnosis Date  . Depression     Past Surgical History:  Procedure Laterality Date  . APPENDECTOMY    . CESAREAN SECTION    . TUBAL LIGATION      Current Outpatient Medications on File Prior to Visit  Medication Sig Dispense Refill  . Ascorbic Acid (VITAMIN C ER PO) Take by mouth.    BLACK CURRANT SEED OIL PO Take by mouth.    . COD LIVER OIL PO Take by mouth.    . CRANBERRY PO Take by mouth.    . Multiple Vitamin (MULTIVITAMIN) tablet Take 1 tablet by mouth daily.     No current facility-administered medications on file prior to visit.    No Known Allergies  Social History:  reports that she has quit smoking. Her smoking use included cigarettes. She has a 4.50 pack-year smoking history. She has never used smokeless tobacco. She reports current alcohol use. She reports current drug use. Drug: Marijuana.  Family History  Problem Relation Age of Onset  . Heart disease Mother   . Hypertension Father   . Hyperlipidemia Father      The following portions of the patient's history were reviewed and updated as appropriate: allergies, current medications, past family history, past medical history, past social history, past surgical history and problem list.  Review of Systems Pertinent items noted in HPI and remainder of comprehensive ROS otherwise negative.  Physical Exam:  BP (!) 163/109   Pulse 92   Ht 5' (1.524 m)   Wt 129 lb 6.4 oz (58.7 kg)   LMP 08/24/2019   BMI 25.27 kg/m  CONSTITUTIONAL: Well-developed, well-nourished female in no acute distress.  HENT:  Normocephalic, atraumatic, External right and left ear normal. Oropharynx is clear and moist EYES: Conjunctivae and EOM are normal. Pupils are equal, round, and reactive to light. No scleral icterus.  NECK: Normal range of motion, supple, no masses.  Normal thyroid.  SKIN: Skin is warm and dry. No rash noted. Not diaphoretic. No erythema. No pallor. MUSCULOSKELETAL: Normal range of motion. No tenderness.  No cyanosis, clubbing, or edema.  2+ distal pulses. NEUROLOGIC: Alert and oriented to person, place, and time. Normal reflexes, muscle tone coordination.  PSYCHIATRIC: Normal mood and affect. Normal behavior. Normal judgment and thought content. CARDIOVASCULAR: Normal heart rate noted, regular rhythm RESPIRATORY: Clear to auscultation bilaterally. Effort  and breath sounds normal, no problems with respiration noted. BREASTS: Symmetric in size. No masses, tenderness, skin changes, nipple drainage, or lymphadenopathy bilaterally. ABDOMEN: Soft, no distention noted.  No tenderness, rebound or guarding.  PELVIC: Normal appearing external genitalia and urethral meatus; normal appearing vaginal mucosa and cervix.  No abnormal discharge noted.  Pap smear obtained.  Normal uterine size, no other palpable masses, no uterine or adnexal tenderness.   Assessment and Plan:    1. Well woman exam with routine gynecological exam - Routine care, reviewed recommendations  for preventive care - Hepatitis B surface antigen - Hepatitis C antibody - HIV Antibody (routine testing w rflx) - RPR - MM 3D SCREEN BREAST BILATERAL; Future - Cervicovaginal ancillary only( [place]) - Cytology - PAP  2. Substance use disorder - Well done!  3. Former smoker   4. Essential hypertension - Significantly higher than at PCP appointment earlier this week. Current plan is to monitor BPs for one month then contact PCP. Given bp reading today, advised patient to contact PCP to see if plan should be adapted  Will follow up results of pap smear and manage accordingly. Mammogram scheduled Routine preventative health maintenance measures emphasized. Please refer to After Visit Summary for other counseling recommendations.      Mallie Snooks, MSN, CNM Certified Nurse Midwife, [employer] [facility] for [employer], [facility] 09/10/19 8:11 PM

## 2019-09-10 NOTE — Progress Notes (Signed)
Pt presents for annual, pap, and all STD testing Need Mammogram

## 2019-09-10 NOTE — Patient Instructions (Signed)
Preventive Care 41-41 Years Old, Female Preventive care refers to visits with your health care provider and lifestyle choices that can promote health and wellness. This includes:  A yearly physical exam. This may also be called an annual well check.  Regular dental visits and eye exams.  Immunizations.  Screening for certain conditions.  Healthy lifestyle choices, such as eating a healthy diet, getting regular exercise, not using drugs or products that contain nicotine and tobacco, and limiting alcohol use. What can I expect for my preventive care visit? Physical exam Your health care provider will check your:  Height and weight. This may be used to calculate body mass index (BMI), which tells if you are at a healthy weight.  Heart rate and blood pressure.  Skin for abnormal spots. Counseling Your health care provider may ask you questions about your:  Alcohol, tobacco, and drug use.  Emotional well-being.  Home and relationship well-being.  Sexual activity.  Eating habits.  Work and work environment.  Method of birth control.  Menstrual cycle.  Pregnancy history. What immunizations do I need?  Influenza (flu) vaccine  This is recommended every year. Tetanus, diphtheria, and pertussis (Tdap) vaccine  You may need a Td booster every 10 years. Varicella (chickenpox) vaccine  You may need this if you have not been vaccinated. Zoster (shingles) vaccine  You may need this after age 41. Measles, mumps, and rubella (MMR) vaccine  You may need at least one dose of MMR if you were born in 1957 or later. You may also need a second dose. Pneumococcal conjugate (PCV13) vaccine  You may need this if you have certain conditions and were not previously vaccinated. Pneumococcal polysaccharide (PPSV23) vaccine  You may need one or two doses if you smoke cigarettes or if you have certain conditions. Meningococcal conjugate (MenACWY) vaccine  You may need this if you  have certain conditions. Hepatitis A vaccine  You may need this if you have certain conditions or if you travel or work in places where you may be exposed to hepatitis A. Hepatitis B vaccine  You may need this if you have certain conditions or if you travel or work in places where you may be exposed to hepatitis B. Haemophilus influenzae type b (Hib) vaccine  You may need this if you have certain conditions. Human papillomavirus (HPV) vaccine  If recommended by your health care provider, you may need three doses over 6 months. You may receive vaccines as individual doses or as more than one vaccine together in one shot (combination vaccines). Talk with your health care provider about the risks and benefits of combination vaccines. What tests do I need? Blood tests  Lipid and cholesterol levels. These may be checked every 5 years, or more frequently if you are over 41 years old.  Hepatitis C test.  Hepatitis B test. Screening  Lung cancer screening. You may have this screening every year starting at age 41 if you have a 30-pack-year history of smoking and currently smoke or have quit within the past 15 years.  Colorectal cancer screening. All adults should have this screening starting at age 41 and continuing until age 75. Your health care provider may recommend screening at age 41 if you are at increased risk. You will have tests every 1-10 years, depending on your results and the type of screening test.  Diabetes screening. This is done by checking your blood sugar (glucose) after you have not eaten for a while (fasting). You may have this   done every 1-3 years.  Mammogram. This may be done every 1-2 years. Talk with your health care provider about when you should start having regular mammograms. This may depend on whether you have a family history of breast cancer.  BRCA-related cancer screening. This may be done if you have a family history of breast, ovarian, tubal, or peritoneal  cancers.  Pelvic exam and Pap test. This may be done every 3 years starting at age 41. Starting at age 41, this may be done every 5 years if you have a Pap test in combination with an HPV test. Other tests  Sexually transmitted disease (STD) testing.  Bone density scan. This is done to screen for osteoporosis. You may have this scan if you are at high risk for osteoporosis. Follow these instructions at home: Eating and drinking  Eat a diet that includes fresh fruits and vegetables, whole grains, lean protein, and low-fat dairy.  Take vitamin and mineral supplements as recommended by your health care provider.  Do not drink alcohol if: ? Your health care provider tells you not to drink. ? You are pregnant, may be pregnant, or are planning to become pregnant.  If you drink alcohol: ? Limit how much you have to 0-1 drink a day. ? Be aware of how much alcohol is in your drink. In the [place] one drink equals one 12 oz bottle of beer (355 mL), one 5 oz glass of wine (148 mL), or one 1 oz glass of hard liquor (44 mL). Lifestyle  Take daily care of your teeth and gums.  Stay active. Exercise for at least 30 minutes on 5 or more days each week.  Do not use any products that contain nicotine or tobacco, such as cigarettes, e-cigarettes, and chewing tobacco. If you need help quitting, ask your health care provider.  If you are sexually active, practice safe sex. Use a condom or other form of birth control (contraception) in order to prevent pregnancy and STIs (sexually transmitted infections).  If told by your health care provider, take low-dose aspirin daily starting at age 41. What's next?  Visit your health care provider once a year for a well check visit.  Ask your health care provider how often you should have your eyes and teeth checked.  Stay up to date on all vaccines. This information is not intended to replace advice given to you by your health care provider. Make sure you  discuss any questions you have with your health care provider. Document Revised: 04/03/2018 Document Reviewed: 04/03/2018 Elsevier Patient Education  2020 [employer].

## 2019-09-11 ENCOUNTER — Encounter: Payer: Self-pay | Admitting: Family Medicine

## 2019-09-11 ENCOUNTER — Encounter: Payer: Self-pay | Admitting: Medical

## 2019-09-11 ENCOUNTER — Ambulatory Visit (INDEPENDENT_AMBULATORY_CARE_PROVIDER_SITE_OTHER): Payer: 59 | Admitting: Medical

## 2019-09-11 VITALS — BP 140/90 | HR 63 | Ht 60.0 in | Wt 129.0 lb

## 2019-09-11 DIAGNOSIS — R519 Headache, unspecified: Secondary | ICD-10-CM | POA: Diagnosis not present

## 2019-09-11 DIAGNOSIS — I10 Essential (primary) hypertension: Secondary | ICD-10-CM | POA: Insufficient documentation

## 2019-09-11 DIAGNOSIS — M5481 Occipital neuralgia: Secondary | ICD-10-CM | POA: Insufficient documentation

## 2019-09-11 LAB — CERVICOVAGINAL ANCILLARY ONLY
Bacterial Vaginitis (gardnerella): NEGATIVE
Candida Glabrata: NEGATIVE
Candida Vaginitis: NEGATIVE
Chlamydia: NEGATIVE
Comment: NEGATIVE
Comment: NEGATIVE
Comment: NEGATIVE
Comment: NEGATIVE
Comment: NEGATIVE
Comment: NORMAL
Neisseria Gonorrhea: NEGATIVE
Trichomonas: NEGATIVE

## 2019-09-11 MED ORDER — AMLODIPINE BESYLATE 5 MG PO TABS: 5.0000 mg | ORAL_TABLET | Freq: Every day | ORAL | 2 refills | Status: DC

## 2019-09-11 NOTE — Patient Instructions (Signed)
Hypertension (high blood pressure): Specific recommendations:  Exercise regularly, preferably every day with aerobic exercise for 30 minutes or more  Avoid added salt in the diet.   Follow a DASH eating plan or Mediterranean eating plan  Hypertension, commonly called high blood pressure, is when the force of blood pumping through your arteries is too strong. Your arteries are the blood vessels that carry blood from your heart throughout your body. A blood pressure reading consists of a higher number over a lower number, such as 110/72. The higher number (systolic) is the pressure inside your arteries when your heart pumps. The lower number (diastolic) is the pressure inside your arteries when your heart relaxes. Ideally you want your blood pressure below 120/80. Hypertension forces your heart to work harder to pump blood. Your arteries may become narrow or stiff. Having hypertension puts you at risk for heart disease, stroke, and other problems.  RISK FACTORS Some risk factors for high blood pressure are controllable. Others are not.  Risk factors you cannot control include:   Race. You may be at higher risk if you are African American.  Age. Risk increases with age.  Gender. Men are at higher risk than women before age 89 years. After age 50, women are at higher risk than men. Risk factors you can control include:  Not getting enough exercise or physical activity.  Being overweight.  Getting too much fat, sugar, calories, or salt in your diet.  Drinking too much alcohol. SIGNS AND SYMPTOMS Hypertension does not usually cause signs or symptoms. Extremely high blood pressure (hypertensive crisis) may cause headache, anxiety, shortness of breath, and nosebleed. DIAGNOSIS  To check if you have hypertension, your health care provider will measure your blood pressure while you are seated, with your arm held at the level of your heart. It should be measured at least twice using the same  arm. Certain conditions can cause a difference in blood pressure between your right and left arms. A blood pressure reading that is higher than normal on one occasion does not mean that you need treatment. If one blood pressure reading is high, ask your health care provider about having it checked again. BLOOD PRESSURE STAGES Blood pressure is classified into four stages: normal, prehypertension, stage 1, and stage 2. Your blood pressure reading will be used to determine what type of treatment, if any, is necessary. Appropriate treatment options are tied to these four stages:  Normal  Systolic pressure (mm Hg): below 120.  Diastolic pressure (mm Hg): below 80. Prehypertension  Systolic pressure (mm Hg): 120 to 139.  Diastolic pressure (mm Hg): 80 to 89. Stage1  Systolic pressure (mm Hg): 140 to 159.  Diastolic pressure (mm Hg): 90 to 99. Stage2  Systolic pressure (mm Hg): 160 or above.  Diastolic pressure (mm Hg): 100 or above. RISKS RELATED TO HIGH BLOOD PRESSURE Managing your blood pressure is an important responsibility. Uncontrolled high blood pressure can lead to:  A heart attack.  A stroke.  A weakened blood vessel (aneurysm).  Heart failure.  Kidney damage.  Eye damage.  Metabolic syndrome.  Memory and concentration problems. TREATMENT  Treating high blood pressure includes making lifestyle changes and possibly taking medicine. Living a healthy lifestyle can help lower high blood pressure. You may need to change some of your habits. Lifestyle changes may include:  Following the DASH diet. This diet is high in fruits, vegetables, and whole grains. It is low in salt, red meat, and added sugars.  Getting at  least 2 hours of brisk physical activity every week.  Losing weight if necessary.  Not smoking.  Limiting alcoholic beverages.  Learning ways to reduce stress. If lifestyle changes are not enough to get your blood pressure under control, your health  care provider may prescribe medicine. You may need to take more than one. Work closely with your health care provider to understand the risks and benefits. HOME CARE INSTRUCTIONS  Have your blood pressure rechecked as directed by your health care provider.   Take medicines only as directed by your health care provider. Follow the directions carefully. Blood pressure medicines must be taken as prescribed. The medicine does not work as well when you skip doses. Skipping doses also puts you at risk for problems.   Do not smoke.   Monitor your blood pressure at home as directed by your health care provider. SEEK MEDICAL CARE IF:   You think you are having a reaction to medicines taken.  You have recurrent headaches or feel dizzy.  You have swelling in your ankles.  You have trouble with your vision. SEEK IMMEDIATE MEDICAL CARE IF:  You develop a severe headache or confusion.  You have unusual weakness, numbness, or feel faint.  You have severe chest or abdominal pain.  You vomit repeatedly.  You have trouble breathing. MAKE SURE YOU:   Understand these instructions.  Will watch your condition.  Will get help right away if you are not doing well or get worse. Document Released: 07/23/2005 Document Revised: 12/07/2013 Document Reviewed: 05/15/2013 [facility] Patient Information 2015 [place], [place]. This information is not intended to replace advice given to you by your health care provider. Make sure you discuss any questions you have with your health care provider.

## 2019-09-11 NOTE — Progress Notes (Signed)
Subjective:     Patient ID: Heidi Molina, female   DOB: 1980/12/08, 39 y.o.   MRN: [id number]  HPI Chief Complaint  Patient presents with  . Hypertension  . Headache    with bilateral red eye    Concerned about hypertension.   She notes elevated BP on prior visits at the emergency department.   She just recently established care here with Harland Dingwall, NP.   She notes having elevated blood pressures for least a year or more.  She averages around  150/98.  She has headaches occasionally.  She denies chest pain, difficulty breathing, swelling, palpitations.  She smokes marijuana but not a cigarette smoker.  Her blood pressure was 163/108 at the dentist office this week.   Exercise - work and dances.   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  Not pregnant, history of tubal ligation.  She was prescribed blood pressure medicine in the past at the emergency department but never took it.  So has never been on steady blood pressure medicine.  otherwise normal state of health without complaints.  No other aggravating or relieving factors. No other complaint.  Past Medical History:  Diagnosis Date  . Depression    Current Outpatient Medications on File Prior to Visit  Medication Sig Dispense Refill  . Ascorbic Acid (VITAMIN C ER PO) Take by mouth.    Marland Kitchen BLACK CURRANT SEED OIL PO Take by mouth.    . COD LIVER OIL PO Take by mouth.    . CRANBERRY PO Take by mouth.    . Multiple Vitamin (MULTIVITAMIN) tablet Take 1 tablet by mouth daily.     No current facility-administered medications on file prior to visit.   Family History  Problem Relation Age of Onset  . Heart disease Mother   . Hypertension Father   . Hyperlipidemia Father       Review of Systems As in subjective    Objective:   Physical Exam  BP 140/90   Pulse 63   Ht 5' (1.524 m)   Wt 129 lb (58.5 kg)   LMP 08/24/2019   SpO2 98%   BMI 25.19 kg/m   General appearence: alert, no distress, WD/WN, African-American female Neck:  supple, no lymphadenopathy, no thyromegaly, no masses Heart: RRR, normal S1, S2, no murmurs Lungs: CTA bilaterally, no wheezes, rhonchi, or rales Pulses: 2+ symmetric, upper and lower extremities, normal cap refill Ext: no edema Neuro: nonfocal exam  EKG  Indication high blood pressure, rate 58 bpm, PR 156 ms, QRS 98 ms, QTC 431 ms, Axis 20 degrees, sinus bradycardia, RSR prime pattern suggesting right ventricular conduction delay, poor R wave progression       Assessment:     Encounter Diagnoses  Name Primary?  . Essential hypertension, benign Yes  . Nonintractable headache, unspecified chronicity pattern, unspecified headache type        Plan:     Hypertension - Discussed diagnosis, possible complications of uncontrolled hypertension.  I reviewed labs that were done here within the past week that were normal.  EKG reviewed  Treatment recommendations including regular aerobic exercise, mediterranean diet, dietary sodium restriction, working towards a health weight, and need for medications.  Begin medication Amlodipine 5 mg, 1 tablet po daily.  Discussed risks/benefits of medication.  Evidence of target organ damage: none.    Check blood pressures 3 times weekly and record.  Follow up in 4-6 weeks.   Anora was seen today for hypertension and headache.  Diagnoses and  all orders for this visit:  Essential hypertension, benign -     EKG 12-Lead  Nonintractable headache, unspecified chronicity pattern, unspecified headache type  Other orders -     amLODipine (NORVASC) 5 MG tablet; Take 1 tablet (5 mg total) by mouth daily.

## 2019-09-14 LAB — CYTOLOGY - PAP
Comment: NEGATIVE
Diagnosis: NEGATIVE
High risk HPV: NEGATIVE

## 2019-09-16 ENCOUNTER — Telehealth: Payer: Self-pay | Admitting: *Deleted

## 2019-09-16 NOTE — Telephone Encounter (Signed)
Pt called to office for lab results. Pt made aware of all recent results.

## 2019-10-15 NOTE — Progress Notes (Signed)
Subjective:    Patient ID: Heidi Molina, female    DOB: 17-Sep-1980, 39 y.o.   MRN: [id number]  HPI Chief Complaint  Patient presents with  . fasting cpe    fasting cpe, sees obgyn,    She is fairly new to the practice and here for a complete physical exam. Previous medical care: no regular care in years. Recent visit to OB/GYN  Other providers: OB/GYN- Clayton Bibles, CNM  HTN - did not start on amlodipine 5 mg.  BP at home has been 140s/80s   Labs done on 09/07/2019 showed normal CMP, CBC,   Complains of a sharp pain in her left low back for the months. More frequent. Does not wake her up. NKI  Smoked cigarettes and stopped in 2019. Smokes marijuana once a week.  She drinks occasionally, 2 beers on the weekend.   Father -HTN and HL. Stroke at late 29s Mother- heart disease and died at age 62    Social history: Lives with daughter 2 kids 58 and 60, works Oceanographer and CNA   Diet: states she has been eating a healthier  Excerise: sit ups, push ups, jogging   Immunizations: Tdap UTD   Health maintenance:  Mammogram: scheduled for October 20 2019  Colonoscopy: N/A Last Gynecological Exam: 09/2019 Last Menstrual cycle: 09/24/2019 Tubal ligation  Last Dental Exam: years  Last Eye Exam: years ago   Wears seatbelt always,  smoke detectors in home and functioning, does not text while driving and feels safe in home environment.   Reviewed allergies, medications, past medical, surgical, family, and social history.   Review of Systems Review of Systems Constitutional: -fever, -chills, -sweats, -unexpected weight change,-fatigue ENT: -runny nose, -ear pain, -sore throat Cardiology:  -chest pain, -palpitations, -edema Respiratory: -cough, -shortness of breath, -wheezing Gastroenterology: -abdominal pain, -nausea, -vomiting, -diarrhea, -constipation  Hematology: -bleeding or bruising problems Musculoskeletal: -arthralgias, -myalgias, -joint swelling,  -back pain Ophthalmology: -vision changes Urology: -dysuria, -difficulty urinating, -hematuria, -urinary frequency, -urgency Neurology: -headache, -weakness, -tingling, -numbness       Objective:   Physical Exam BP 140/90   Pulse 93   Temp 98.7 F (37.1 C)   Ht 5' 1.5" (1.562 m)   Wt 129 lb 3.2 oz (58.6 kg)   SpO2 98%   BMI 24.02 kg/m   General Appearance:    Alert, cooperative, no distress, appears stated age  Head:    Normocephalic, without obvious abnormality, atraumatic  Eyes:    PERRL, conjunctiva/corneas clear, EOM's intact  Ears:    Normal TM's and external ear canals  Nose:   Mask in place   Throat:   Mask in place   Neck:   Supple, no lymphadenopathy;  thyroid:  no   enlargement/tenderness/nodules; no carotid   bruit or JVD  Back:    Spine nontender, no curvature, ROM normal, no CVA     tenderness  Lungs:     Clear to auscultation bilaterally without wheezes, rales or     ronchi; respirations unlabored  Chest Wall:    No tenderness or deformity   Heart:    Regular rate and rhythm, S1 and S2 normal, no murmur, rub   or gallop  Breast Exam:    No tenderness, masses, or nipple discharge or inversion.      No axillary lymphadenopathy  Abdomen:     Soft, non-tender, nondistended, normoactive bowel sounds,    no masses, no hepatosplenomegaly  Genitalia:    OB/GYN     Extremities:  No clubbing, cyanosis or edema  Pulses:   2+ and symmetric all extremities  Skin:   Skin color, texture, turgor normal, no rashes or lesions  Lymph nodes:   Cervical, supraclavicular, and axillary nodes normal  Neurologic:   CNII-XII intact, normal strength, sensation and gait; reflexes 2+ and symmetric throughout          Psych:   Normal mood, affect, hygiene and grooming.         Assessment & Plan:  Routine general medical examination at a health care facility - Plan: POCT Urinalysis DIP (Proadvantage Device), Lipid panel, TSH, T4, free -She is here today for a fasting CPE.  This is my  second visit with her. Counseling on preventive healthcare and she is up-to-date on Pap smear and mammogram scheduled later this month.  Immunizations reviewed and up-to-date. Discussed safety and health promotion. Recommend eating a healthy diet and getting at least 150 minutes of physical activity each week.  Essential hypertension, benign -She was prescribed amlodipine for her blood pressure but has not picked it up yet and states she does not really want to be on prescription medication.  In-depth counseling on controlling hypertension with diet and exercise but also needing medication at some point and I do feel that she is to this point.  She has a family history of hypertension and heart disease, stroke in her parents.  I discussed how uncontrolled hypertension can lead to these health conditions.  She is in agreement to start on amlodipine and follow-up in 4 weeks.  Encouraged her to check her blood pressure at home regularly.  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  Discussed good posture.  She does not need medication.  She will follow-up if this is worsening  Urinalysis dipstick-

## 2019-10-15 NOTE — Patient Instructions (Addendum)
I recommend you contact your pharmacy and pick up the blood pressure medication called amlodipine. Start taking this once daily. Keep an eye on your blood pressure at home and follow-up with me in 4 weeks if you decide to start taking the medication regularly. Continue eating a low-sodium diet and get at least 150 minutes of physical activity each week.    Preventive Care 76-41 Years Old Years Old, Female Preventive care refers to visits with your health care provider and lifestyle choices that can promote health and wellness. This includes:  A yearly physical exam. This may also be called an annual well check.  Regular dental visits and eye exams.  Immunizations.  Screening for certain conditions.  Healthy lifestyle choices, such as eating a healthy diet, getting regular exercise, not using drugs or products that contain nicotine and tobacco, and limiting alcohol use. What can I expect for my preventive care visit? Physical exam Your health care provider will check your:  Height and weight. This may be used to calculate body mass index (BMI), which tells if you are at a healthy weight.  Heart rate and blood pressure.  Skin for abnormal spots. Counseling Your health care provider may ask you questions about your:  Alcohol, tobacco, and drug use.  Emotional well-being.  Home and relationship well-being.  Sexual activity.  Eating habits.  Work and work Statistician.  Method of birth control.  Menstrual cycle.  Pregnancy history. What immunizations do I need?  Influenza (flu) vaccine  This is recommended every year. Tetanus, diphtheria, and pertussis (Tdap) vaccine  You may need a Td booster every 10 years. Varicella (chickenpox) vaccine  You may need this if you have not been vaccinated. Zoster (shingles) vaccine  You may need this after age 20. Measles, mumps, and rubella (MMR) vaccine  You may need at least one dose of MMR if you were born in 1957 or later. You  may also need a second dose. Pneumococcal conjugate (PCV13) vaccine  You may need this if you have certain conditions and were not previously vaccinated. Pneumococcal polysaccharide (PPSV23) vaccine  You may need one or two doses if you smoke cigarettes or if you have certain conditions. Meningococcal conjugate (MenACWY) vaccine  You may need this if you have certain conditions. Hepatitis A vaccine  You may need this if you have certain conditions or if you travel or work in places where you may be exposed to hepatitis A. Hepatitis B vaccine  You may need this if you have certain conditions or if you travel or work in places where you may be exposed to hepatitis B. Haemophilus influenzae type b (Hib) vaccine  You may need this if you have certain conditions. Human papillomavirus (HPV) vaccine  If recommended by your health care provider, you may need three doses over 6 months. You may receive vaccines as individual doses or as more than one vaccine together in one shot (combination vaccines). Talk with your health care provider about the risks and benefits of combination vaccines. What tests do I need? Blood tests  Lipid and cholesterol levels. These may be checked every 5 years, or more frequently if you are over 67 years old.  Hepatitis C test.  Hepatitis B test. Screening  Lung cancer screening. You may have this screening every year starting at age 62 if you have a 30-pack-year history of smoking and currently smoke or have quit within the past 15 years.  Colorectal cancer screening. All adults should have this screening starting at age  22 and continuing until age 27. Your health care provider may recommend screening at age 65 if you are at increased risk. You will have tests every 1-10 years, depending on your results and the type of screening test.  Diabetes screening. This is done by checking your blood sugar (glucose) after you have not eaten for a while (fasting). You  may have this done every 1-3 years.  Mammogram. This may be done every 1-2 years. Talk with your health care provider about when you should start having regular mammograms. This may depend on whether you have a family history of breast cancer.  BRCA-related cancer screening. This may be done if you have a family history of breast, ovarian, tubal, or peritoneal cancers.  Pelvic exam and Pap test. This may be done every 3 years starting at age 64. Starting at age 65, this may be done every 5 years if you have a Pap test in combination with an HPV test. Other tests  Sexually transmitted disease (STD) testing.  Bone density scan. This is done to screen for osteoporosis. You may have this scan if you are at high risk for osteoporosis. Follow these instructions at home: Eating and drinking  Eat a diet that includes fresh fruits and vegetables, whole grains, lean protein, and low-fat dairy.  Take vitamin and mineral supplements as recommended by your health care provider.  Do not drink alcohol if: ? Your health care provider tells you not to drink. ? You are pregnant, may be pregnant, or are planning to become pregnant.  If you drink alcohol: ? Limit how much you have to 0-1 drink a day. ? Be aware of how much alcohol is in your drink. In the [place] one drink equals one 12 oz bottle of beer (355 mL), one 5 oz glass of wine (148 mL), or one 1 oz glass of hard liquor (44 mL). Lifestyle  Take daily care of your teeth and gums.  Stay active. Exercise for at least 30 minutes on 5 or more days each week.  Do not use any products that contain nicotine or tobacco, such as cigarettes, e-cigarettes, and chewing tobacco. If you need help quitting, ask your health care provider.  If you are sexually active, practice safe sex. Use a condom or other form of birth control (contraception) in order to prevent pregnancy and STIs (sexually transmitted infections).  If told by your health care provider,  take low-dose aspirin daily starting at age 45. What's next?  Visit your health care provider once a year for a well check visit.  Ask your health care provider how often you should have your eyes and teeth checked.  Stay up to date on all vaccines. This information is not intended to replace advice given to you by your health care provider. Make sure you discuss any questions you have with your health care provider. Document Revised: 04/03/2018 Document Reviewed: 04/03/2018 Elsevier Patient Education  2020 [employer].

## 2019-10-16 ENCOUNTER — Encounter: Payer: Self-pay | Admitting: Family Medicine

## 2019-10-16 ENCOUNTER — Ambulatory Visit (INDEPENDENT_AMBULATORY_CARE_PROVIDER_SITE_OTHER): Payer: 59 | Admitting: Family Medicine

## 2019-10-16 ENCOUNTER — Other Ambulatory Visit: Payer: Self-pay

## 2019-10-16 VITALS — BP 140/90 | HR 93 | Temp 98.7°F | Ht 61.5 in | Wt 129.2 lb

## 2019-10-16 DIAGNOSIS — Z13220 Encounter for screening for lipoid disorders: Secondary | ICD-10-CM | POA: Diagnosis not present

## 2019-10-16 DIAGNOSIS — Z1329 Encounter for screening for other suspected endocrine disorder: Secondary | ICD-10-CM | POA: Diagnosis not present

## 2019-10-16 DIAGNOSIS — Z0000 Encounter for general adult medical examination without abnormal findings: Secondary | ICD-10-CM

## 2019-10-16 DIAGNOSIS — G8929 Other chronic pain: Secondary | ICD-10-CM

## 2019-10-16 DIAGNOSIS — I10 Essential (primary) hypertension: Secondary | ICD-10-CM

## 2019-10-16 DIAGNOSIS — M545 Low back pain, unspecified: Secondary | ICD-10-CM

## 2019-10-16 LAB — POCT URINALYSIS DIP (PROADVANTAGE DEVICE)
Bilirubin, UA: NEGATIVE
Blood, UA: NEGATIVE
Glucose, UA: NEGATIVE mg/dL
Leukocytes, UA: NEGATIVE
Nitrite, UA: NEGATIVE
Specific Gravity, Urine: 1.03
Urobilinogen, Ur: NEGATIVE
pH, UA: 6 (ref 5.0–8.0)

## 2019-10-17 LAB — T4, FREE: Free T4: 0.94 ng/dL (ref 0.82–1.77)

## 2019-10-17 LAB — LIPID PANEL
Chol/HDL Ratio: 2.2 ratio (ref 0.0–4.4)
Cholesterol, Total: 129 mg/dL (ref 100–199)
HDL: 60 mg/dL (ref >39)
LDL Chol Calc (NIH): 61 mg/dL (ref 0–99)
Triglycerides: 29 mg/dL (ref 0–149)
VLDL Cholesterol Cal: 8 mg/dL (ref 5–40)

## 2019-10-17 LAB — TSH: TSH: 0.518 u[IU]/mL (ref 0.450–4.500)

## 2019-10-20 ENCOUNTER — Ambulatory Visit: Payer: 59

## 2019-10-22 ENCOUNTER — Ambulatory Visit
Admission: RE | Admit: 2019-10-22 | Discharge: 2019-10-22 | Disposition: A | Discharge status: Home / Self Care | Payer: 59 | Source: Ambulatory Visit | Attending: Advanced Practice Midwife | Admitting: Advanced Practice Midwife

## 2019-10-22 ENCOUNTER — Other Ambulatory Visit: Payer: Self-pay

## 2019-10-22 DIAGNOSIS — Z01419 Encounter for gynecological examination (general) (routine) without abnormal findings: Secondary | ICD-10-CM

## 2019-11-16 ENCOUNTER — Encounter: Payer: Self-pay | Admitting: Family Medicine

## 2019-11-16 ENCOUNTER — Ambulatory Visit (INDEPENDENT_AMBULATORY_CARE_PROVIDER_SITE_OTHER): Payer: 59 | Admitting: Family Medicine

## 2019-11-16 ENCOUNTER — Other Ambulatory Visit: Payer: Self-pay

## 2019-11-16 VITALS — BP 128/80 | Wt 133.0 lb

## 2019-11-16 DIAGNOSIS — I10 Essential (primary) hypertension: Secondary | ICD-10-CM

## 2019-11-16 NOTE — Progress Notes (Signed)
   Subjective:  Documentation for virtual audio and video telecommunications through [employer] encounter:  The patient was located at home. 2 patient identifiers used.  The provider was located in the office. The patient did consent to this visit and is aware of possible charges through their insurance for this visit.  The other persons participating in this telemedicine service were none.    Patient ID: Heidi Molina, female    DOB: Jan 04, 1981, 39 y.o.   MRN: [id number]  HPI Chief Complaint  Patient presents with  . elevated bp    elevated bp- reading 136//82. been taking bp med   This is a follow up on HTN. She started on amlodipine 5 mg 4 weeks ago. States she has been watching her diet and eating more smoothies lately. Less sodium overall.   She has been checking her BP at home and readings have been improving.  Today she is in normal range.   No side effects.   States she received her 2nd Covid vaccine 2 days ago and doing well.   No fever, chills, dizziness, chest pain, N/V/D, LE edema.    Reviewed allergies, medications, past medical, surgical, family, and social history.    Review of Systems Pertinent positives and negatives in the history of present illness.     Objective:   Physical Exam BP 128/80   Wt 133 lb (60.3 kg)   LMP 10/22/2019   BMI 24.72 kg/m   Alert and oriented and in no acute distress.       Assessment & Plan:  Essential hypertension, benign  She is doing well on amlodipine and no side effects. BP responding well.  Works at [employer] and knows how to monitor BP.  She will keep an eye on her BP and let me know if her readings are not in goal range.  Counseled on low sodium diet.  Follow up in 6 months.   Time spent on call was 8 minutes and in review of previous records 10 minutes total.  This virtual service is not related to other E/M service within previous 7 days.

## 2019-11-16 NOTE — Progress Notes (Signed)
done

## 2020-02-20 ENCOUNTER — Emergency Department (HOSPITAL_COMMUNITY)
Admission: EM | Admit: 2020-02-20 | Discharge: 2020-02-20 | Disposition: A | Discharge status: Home / Self Care | Payer: 59 | Attending: Emergency Medicine | Admitting: Emergency Medicine

## 2020-02-20 ENCOUNTER — Emergency Department (HOSPITAL_COMMUNITY): Payer: 59

## 2020-02-20 ENCOUNTER — Encounter (HOSPITAL_COMMUNITY): Payer: Self-pay

## 2020-02-20 DIAGNOSIS — Z87891 Personal history of nicotine dependence: Secondary | ICD-10-CM | POA: Diagnosis not present

## 2020-02-20 DIAGNOSIS — M542 Cervicalgia: Secondary | ICD-10-CM | POA: Diagnosis not present

## 2020-02-20 DIAGNOSIS — Y92410 Unspecified street and highway as the place of occurrence of the external cause: Secondary | ICD-10-CM | POA: Insufficient documentation

## 2020-02-20 DIAGNOSIS — Y93I9 Activity, other involving external motion: Secondary | ICD-10-CM | POA: Diagnosis not present

## 2020-02-20 DIAGNOSIS — Z79899 Other long term (current) drug therapy: Secondary | ICD-10-CM | POA: Insufficient documentation

## 2020-02-20 DIAGNOSIS — I10 Essential (primary) hypertension: Secondary | ICD-10-CM | POA: Diagnosis not present

## 2020-02-20 DIAGNOSIS — Y999 Unspecified external cause status: Secondary | ICD-10-CM | POA: Diagnosis not present

## 2020-02-20 MED ORDER — IBUPROFEN 600 MG PO TABS: 600.0000 mg | ORAL_TABLET | Freq: Four times a day (QID) | ORAL | 0 refills | Status: DC | PRN

## 2020-02-20 MED ORDER — CYCLOBENZAPRINE HCL 10 MG PO TABS: 10.0000 mg | ORAL_TABLET | Freq: Two times a day (BID) | ORAL | 0 refills | Status: DC | PRN
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
## 2020-05-17 ENCOUNTER — Encounter: Payer: 59 | Admitting: Family Medicine

## 2020-05-18 ENCOUNTER — Encounter: Payer: 59 | Admitting: Family Medicine

## 2020-06-28 ENCOUNTER — Other Ambulatory Visit: Payer: Self-pay

## 2020-06-28 ENCOUNTER — Ambulatory Visit (INDEPENDENT_AMBULATORY_CARE_PROVIDER_SITE_OTHER): Payer: 59 | Admitting: Family Medicine

## 2020-06-28 ENCOUNTER — Encounter: Payer: Self-pay | Admitting: Family Medicine

## 2020-06-28 VITALS — BP 120/70 | HR 76 | Wt 140.2 lb

## 2020-06-28 DIAGNOSIS — I10 Essential (primary) hypertension: Secondary | ICD-10-CM

## 2020-06-28 LAB — COMPREHENSIVE METABOLIC PANEL
ALT: 8 IU/L (ref 0–32)
AST: 13 IU/L (ref 0–40)
Albumin/Globulin Ratio: 1.6 (ref 1.2–2.2)
Albumin: 4.3 g/dL (ref 3.8–4.8)
Alkaline Phosphatase: 75 IU/L (ref 44–121)
BUN/Creatinine Ratio: 14 (ref 9–23)
BUN: 12 mg/dL (ref 6–24)
Bilirubin Total: 0.3 mg/dL (ref 0.0–1.2)
CO2: 25 mmol/L (ref 20–29)
Calcium: 9.5 mg/dL (ref 8.7–10.2)
Chloride: 105 mmol/L (ref 96–106)
Creatinine, Ser: 0.85 mg/dL (ref 0.57–1.00)
GFR calc Af Amer: 98 mL/min/{1.73_m2} (ref >59)
GFR calc non Af Amer: 85 mL/min/{1.73_m2} (ref >59)
Globulin, Total: 2.7 g/dL (ref 1.5–4.5)
Glucose: 98 mg/dL (ref 65–99)
Potassium: 4.2 mmol/L (ref 3.5–5.2)
Sodium: 141 mmol/L (ref 134–144)
Total Protein: 7 g/dL (ref 6.0–8.5)

## 2020-06-28 LAB — CBC WITH DIFFERENTIAL/PLATELET
Basophils Absolute: 0 10*3/uL (ref 0.0–0.2)
Basos: 1 %
EOS (ABSOLUTE): 0 10*3/uL (ref 0.0–0.4)
Eos: 1 %
Hematocrit: 37.1 % (ref 34.0–46.6)
Hemoglobin: 12.7 g/dL (ref 11.1–15.9)
Immature Grans (Abs): 0 10*3/uL (ref 0.0–0.1)
Immature Granulocytes: 0 %
Lymphocytes Absolute: 1.6 10*3/uL (ref 0.7–3.1)
Lymphs: 32 %
MCH: 30.5 pg (ref 26.6–33.0)
MCHC: 34.2 g/dL (ref 31.5–35.7)
MCV: 89 fL (ref 79–97)
Monocytes Absolute: 0.5 10*3/uL (ref 0.1–0.9)
Monocytes: 10 %
Neutrophils Absolute: 2.9 10*3/uL (ref 1.4–7.0)
Neutrophils: 56 %
Platelets: 404 10*3/uL (ref 150–450)
RBC: 4.17 x10E6/uL (ref 3.77–5.28)
RDW: 12.2 % (ref 11.7–15.4)
WBC: 5.1 10*3/uL (ref 3.4–10.8)

## 2020-06-28 NOTE — Progress Notes (Signed)
° °  Subjective:    Patient ID: Heidi Molina, female    DOB: Jun 10, 1981, 39 y.o.   MRN: [id number]  HPI Chief Complaint  Patient presents with   6 month follow-up    6 month follow-up   She is here to follow up on HTN.  She is taking her BP and getting normal readings.   States she is eating a low salt diet.   She takes a muscle relaxant.  Dr. Allison Quarry is prescribing.   No other concerns today.   Denies fever, chills, dizziness, chest pain, palpitations, shortness of breath, abdominal pain, N/V/D, urinary symptoms, LE edema.   Current Outpatient Medications on File Prior to Visit  Medication Sig Dispense Refill   amLODipine (NORVASC) 5 MG tablet Take 1 tablet (5 mg total) by mouth daily. 30 tablet 2   Ascorbic Acid (VITAMIN C ER PO) Take by mouth.     BLACK CURRANT SEED OIL PO Take by mouth.     COD LIVER OIL PO Take by mouth.     CRANBERRY PO Take by mouth.     cyclobenzaprine (FLEXERIL) 10 MG tablet Take 1 tablet (10 mg total) by mouth 2 (two) times daily as needed for muscle spasms. 20 tablet 0   ibuprofen (ADVIL) 600 MG tablet Take 1 tablet (600 mg total) by mouth every 6 (six) hours as needed. 30 tablet 0   Multiple Vitamin (MULTIVITAMIN) tablet Take 1 tablet by mouth daily.     No current facility-administered medications on file prior to visit.     Reviewed allergies, medications, past medical, surgical, family, and social history.   Review of Systems Pertinent positives and negatives in the history of present illness.     Objective:   Physical Exam BP 120/70    Pulse 76    Wt 140 lb 3.2 oz (63.6 kg)    BMI 27.38 kg/m   Alert and in no distress.  Cardiac exam shows a regular sinus rhythm without murmurs or gallops. Lungs are clear to auscultation.      Assessment & Plan:  Essential hypertension, benign - Plan: Comprehensive metabolic panel, CBC with Differential/Platelet  Blood pressure well controlled.  Continue on amlodipine daily.  Continue  low-sodium diet.  She will check her blood pressures at home and let me know if her readings become elevated. Recommend she get her Covid booster when due in January. Return here for fasting CPE in 6 months.

## 2020-06-28 NOTE — Patient Instructions (Signed)
Your blood pressure is in goal range today.  Continue on your current medication.  Continue with a low-sodium diet.

## 2020-08-17 ENCOUNTER — Telehealth (INDEPENDENT_AMBULATORY_CARE_PROVIDER_SITE_OTHER): Payer: 59 | Admitting: Family Medicine

## 2020-08-17 ENCOUNTER — Other Ambulatory Visit: Payer: Self-pay

## 2020-08-17 ENCOUNTER — Telehealth: Payer: 59 | Admitting: Family Medicine

## 2020-08-17 ENCOUNTER — Encounter: Payer: Self-pay | Admitting: Family Medicine

## 2020-08-17 VITALS — Temp 100.1°F | Wt 140.0 lb

## 2020-08-17 DIAGNOSIS — R52 Pain, unspecified: Secondary | ICD-10-CM

## 2020-08-17 DIAGNOSIS — U071 COVID-19: Secondary | ICD-10-CM | POA: Diagnosis not present

## 2020-08-17 DIAGNOSIS — R059 Cough, unspecified: Secondary | ICD-10-CM

## 2020-08-17 DIAGNOSIS — J3489 Other specified disorders of nose and nasal sinuses: Secondary | ICD-10-CM | POA: Diagnosis not present

## 2020-08-17 NOTE — Progress Notes (Signed)
   Subjective:  Documentation for virtual audio and video telecommunications through Caregility encounter:  The patient was located at work, [employer] [facility]. 2 patient identifiers used.  The provider was located in the office. The patient did consent to this visit and is aware of possible charges through their insurance for this visit.  The other persons participating in this telemedicine service were none. Time spent on call was 14 minutes and in review of previous records >20 minutes total.  This virtual service is not related to other E/M service within previous 7 days.   Patient ID: Heidi Molina, female    DOB: 07/13/1981, 40 y.o.   MRN: [id number]  HPI Chief Complaint  Patient presents with  . Cough  . Nasal Congestion  . body pain   Complains of a 6 day fever, chills, body aches, fatigue, history sneezing, rhinorrhea, cough. Onset of symptoms 08/13/2019. States she is at her job and had a positive rapid Covid test just now. She is waiting on her boyfriend to drive her home.  No chest pain, shortness of breath, abdominal pain, N/V/D.   Taking Theraflu, Nyquil, Tylenol.  Zicam.  Taking a women's One A Day  LMP: last week and tubal.   Reviewed allergies, medications, past medical, surgical, family, and social history.    Review of Systems Pertinent positives and negatives in the history of present illness.     Objective:   Physical Exam Temp 100.1 F (37.8 C)   Wt 140 lb (63.5 kg)   BMI 27.34 kg/m   Alert and oriented in no acute distress.  Respirations unlabored.  Speaking in complete sentences without difficulty.      Assessment & Plan:  COVID-19 virus infection  Body aches  Rhinorrhea  Cough  She reports having a positive rapid COVID test this morning at work.  She is now going home and will quarantine.  We discussed how she should isolate in her household and in the community.  She should wear her mask and use good hand hygiene.  We  discussed supportive care and taking good care of her self. Advised that she would need to remain quarantine until Monday, 08/23/2019 per CDC guidelines and then let me know if she is still having symptoms at that time.  States her job may require her to take another COVID test.  Discussed that this may remain positive for quite some time but that she should follow their recommendations.  She will follow-up as needed.

## 2020-08-22 NOTE — Progress Notes (Signed)
   Subjective:    Patient ID: Heidi Molina, female    DOB: 08/16/1980, 40 y.o.   MRN: [id number]  HPI Appears to be a duplicate note started for visit on same day.    Review of Systems     Objective:   Physical Exam        Assessment & Plan:

## 2020-09-09 ENCOUNTER — Ambulatory Visit (INDEPENDENT_AMBULATORY_CARE_PROVIDER_SITE_OTHER): Payer: 59 | Admitting: Family Medicine

## 2020-09-09 ENCOUNTER — Encounter: Payer: Self-pay | Admitting: Family Medicine

## 2020-09-09 ENCOUNTER — Other Ambulatory Visit: Payer: Self-pay

## 2020-09-09 ENCOUNTER — Telehealth: Payer: Self-pay

## 2020-09-09 VITALS — BP 120/70 | HR 83 | Temp 98.7°F | Wt 137.8 lb

## 2020-09-09 DIAGNOSIS — M62838 Other muscle spasm: Secondary | ICD-10-CM | POA: Diagnosis not present

## 2020-09-09 MED ORDER — CYCLOBENZAPRINE HCL 5 MG PO TABS: 5.0000 mg | ORAL_TABLET | Freq: Three times a day (TID) | ORAL | 0 refills | Status: DC | PRN
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
## 2020-09-09 NOTE — Telephone Encounter (Signed)
Return call to pt regarding request for OOW note due to neck pain. No answer LVM  Pt will need to f/u with urgent care or PCP.

## 2020-09-09 NOTE — Patient Instructions (Signed)
Take 800 mg of ibuprofen 3 times a day with food.  Do this for the next 2 to 3 days or longer if needed.  Take the muscle relaxant but be aware that it can cause sedation.  Do not drive after taking it.  Use heat or ice and a topical pain medication to the area.

## 2020-09-20 ENCOUNTER — Telehealth: Payer: Self-pay | Admitting: Family Medicine

## 2020-09-20 ENCOUNTER — Other Ambulatory Visit: Payer: Self-pay | Admitting: Medical

## 2020-09-20 MED ORDER — AMLODIPINE BESYLATE 5 MG PO TABS: 5.0000 mg | ORAL_TABLET | Freq: Every day | ORAL | 2 refills | Status: DC

## 2020-09-20 NOTE — Telephone Encounter (Signed)
Heidi Molina pt, She needs refill on [facility]

## 2021-01-03 ENCOUNTER — Encounter: Payer: 59 | Admitting: Family Medicine

## 2021-01-17 ENCOUNTER — Other Ambulatory Visit: Payer: Self-pay | Admitting: Advanced Practice Midwife

## 2021-01-17 DIAGNOSIS — Z1231 Encounter for screening mammogram for malignant neoplasm of breast: Secondary | ICD-10-CM

## 2021-01-24 ENCOUNTER — Encounter: Payer: 59 | Admitting: Family Medicine

## 2021-02-01 ENCOUNTER — Encounter: Payer: Self-pay | Admitting: Internal Medicine

## 2021-02-21 ENCOUNTER — Other Ambulatory Visit (HOSPITAL_COMMUNITY)
Admission: RE | Admit: 2021-02-21 | Discharge: 2021-02-21 | Disposition: A | Discharge status: Home / Self Care | Payer: 59 | Source: Ambulatory Visit | Attending: Nurse Practitioner | Admitting: Nurse Practitioner

## 2021-02-21 ENCOUNTER — Encounter: Payer: Self-pay | Admitting: Nurse Practitioner

## 2021-02-21 ENCOUNTER — Ambulatory Visit (INDEPENDENT_AMBULATORY_CARE_PROVIDER_SITE_OTHER): Payer: 59 | Admitting: Nurse Practitioner

## 2021-02-21 ENCOUNTER — Other Ambulatory Visit: Payer: Self-pay

## 2021-02-21 VITALS — BP 171/114 | HR 67 | Wt 139.0 lb

## 2021-02-21 DIAGNOSIS — Z01419 Encounter for gynecological examination (general) (routine) without abnormal findings: Secondary | ICD-10-CM

## 2021-02-21 NOTE — Progress Notes (Signed)
Last pap 09/2019- normal  STD testing today Mammogram scheduled- 03/2021

## 2021-02-21 NOTE — Patient Instructions (Addendum)
SEE YOUR DOCTOR and have your blood pressure evaluated. Be sure to take your blood pressure medicine daily.     AREA FAMILY PRACTICE PHYSICIANS  [place] ([phone]) [facility] [address] [place], [place] [address] [phone] Mon-Fri 8:30-12:30, 1:30-5:00 Accepting [facility] at [facility] [address], [place], [place] [address] [phone] Mon-Fri 8:00-5:30 [facility] [address] [place], [place] [address] [phone] [place], Thur, Fri 8:30-5:00, Wed 10:00-7:00 (closed 1-2pm) Accepting [facility] [address], [place], [place]  [address] Phone - [phone]   Fax - [phone]  [place] [phone]) [facility] [address] [place], [place] [address] [phone] Mon-Fri 8:00-5:00 [facility] at [facility])  [address] Sherian Maroon [place], [place] [address] [phone] Mon-Fri 8:30-5:30, Sat (Oct.-Mar.) 9:00-1:00 Accepting Medicaid  [place] [phone]) [facility] at [facility] [address], [place], [place] [address] [phone] Mon-Fri 8:00-5:00  [place] [phone]) [facility] at [facility] [address] [place], [place] [address] [phone] Mon-Fri 8:00-5:00 [facility] [address] [place], [place], [place] [address] [phone] Mon-Fri 8:00-5:00 [place] [facility] [address] [place], [place] [address] [phone] Mon-Fri 8:00-5:00 [facility] [address] [place] [address] [phone] Mon-Fri 7:30-5:30  [place] [phone] & [phone]) [facility] [address], [place], [place] [address] [phone] Mon-Thur 8:00-6:00 Accepting [facility]  Medicine [address] [place], [place] [address] [phone] Mon-Thur 7:30-7:30, Fri 7:30-4:30 Accepting [facility] at [facility] [address], [place], [place]  [address] [phone]   Fax - [phone]  [place] [phone] & [phone]) Adult nurse HealthCare at [facility] [address] [place], [place] [address] [phone] Mon-Fri 7:00-5:00 [facility] [address], [place], [place] [address] [phone] Mon-Fri 8:00-5:00 Accepting Medicaid [facility] - [facility] [address] [place], [place], [place] [address] [phone] Mon-Fri 8:00-5:00 Accepting Medicaid  [place] [phone]) [facility] at [facility] [address] [place], [place] [address] [phone] Mon-Fri 8:00-5:00 [facility] - [facility] Family Medicine at [facility]) [address], [place], [place] [address] [phone] Mon-Fri 8:00-5:00 Accepting Medicaid [facility] - [facility] ([facility]) [address], [place], [place] [address] [phone] Mon-Fri 8:00-5:30, Sat&Sun by appointment (phones open at 8:30) Accepting [facility] [phone] & [phone]) [facility] [address], [place], [place] [address] [phone] Mon-Thur 8:00-7:00, Fri 8:00-5:00, Sat 8:00-12:00, Sun 9:00-12:00 Accepting Medicaid [facility] - Family Medicine at [facility] [address] [place], [place] [address] [phone] Mon-Thur 8:00-5:00 Accepting Medicaid [facility] at [place] [address] Sherian Maroon [place], [place] [address] [phone] Mon-Fri 8:00-5:30, Sat (Oct.-Mar.) 9:00-1:00 Accepting [facility]  [place] [phone]) [facility] Medicine [address] [place], [place]  [address] [phone] Mon-Fri 8:00-5:00 Accepting [facility]   [place] [phone]) [place] at [facility] [address], [place], [place] [address] [phone] Mon-Fri 8:00-5:00 [place] [facility] at [facility] [address] [place], [place] [address] [phone] Mon-Fri 8:00-5:00 [facility] - [place] [address], [place], [place] [address] [phone] Mon-Fri 8:00-5:00 After hours clinic (  [address], [place], [place] [address]) [phone] Mon-Fri 5:00-8:00, Sat 12:00-6:00, Sun 10:00-4:00 Accepting Medicaid [facility] at [facility]. [address], [place], [place]  [address] [phone]   Fax - [phone]  [place] [phone]) Adult nurse HealthCare at [facility] [address] [place], [place], [place] [address] [phone] Mon-Fri 8:00-5:00 [facility] - [place] [facility] [place]) [address] [place] [address] [place], [place] [address] [phone] Mon-Thur 8:00-7:00, Fri 8:00-5:00, Sat 8:00-12:00

## 2021-02-21 NOTE — Progress Notes (Signed)
GYNECOLOGY ANNUAL PREVENTATIVE CARE ENCOUNTER NOTE  Subjective:   Heidi Molina is a 40 y.o. G24P1002 female here for a routine annual gynecologic exam.  Current complaints: None.   Denies abnormal vaginal bleeding, discharge, pelvic pain, problems with intercourse or other gynecologic concerns.  Wants STD testing done.  Blood work ordered last year but was never drawn   Gynecologic History Patient's last menstrual period was 02/18/2021. Contraception: tubal ligation Last Pap: 09-10-19. Results were: normal Last mammogram: 10-23-19. Results were: normal  Obstetric History OB History  Gravida Para Term Preterm AB Living  2 1 1     2   SAB IAB Ectopic Multiple Live Births          2    # Outcome Date GA Lbr Len/2nd Weight Sex Delivery Anes PTL Lv  2 Term      CS-LTranv     1 Gravida      CS-LTranv       Past Medical History:  Diagnosis Date   Depression    Hypertension     Past Surgical History:  Procedure Laterality Date   APPENDECTOMY     CESAREAN SECTION     TUBAL LIGATION      Current Outpatient Medications on File Prior to Visit  Medication Sig Dispense Refill   amLODipine (NORVASC) 5 MG tablet Take 1 tablet (5 mg total) by mouth daily. 30 tablet 2   Ascorbic Acid (VITAMIN C ER PO) Take by mouth.     BLACK CURRANT SEED OIL PO Take by mouth.     COD LIVER OIL PO Take by mouth.     CRANBERRY PO Take by mouth.     cyclobenzaprine (FLEXERIL) 5 MG tablet Take 1 tablet (5 mg total) by mouth 3 (three) times daily as needed for muscle spasms. 20 tablet 0   Multiple Vitamin (MULTIVITAMIN) tablet Take 1 tablet by mouth daily.     ibuprofen (ADVIL) 600 MG tablet Take 1 tablet (600 mg total) by mouth every 6 (six) hours as needed. (Patient not taking: Reported on 02/21/2021) 30 tablet 0   No current facility-administered medications on file prior to visit.    No Known Allergies  Social History   Socioeconomic History   Marital status: Single    Spouse name: Not on file    Number of children: Not on file   Years of education: Not on file   Highest education level: Not on file  Occupational History   Not on file  Tobacco Use   Smoking status: Former    Packs/day: 0.50    Years: 9.00    Pack years: 4.50    Types: Cigarettes   Smokeless tobacco: Never  Vaping Use   Vaping Use: Never used  Substance and Sexual Activity   Alcohol use: Yes    Comment: occasional    Drug use: Yes    Types: Marijuana    Comment: occasionally   Sexual activity: Not on file  Other Topics Concern   Not on file  Social History Narrative   Not on file   Social Determinants of Health   Financial Resource Strain: Not on file  Food Insecurity: Not on file  Transportation Needs: Not on file  Physical Activity: Not on file  Stress: Not on file  Social Connections: Not on file  Intimate Partner Violence: Not on file    Family History  Problem Relation Age of Onset   Heart disease Mother 35   Hypertension Father  Hyperlipidemia Father    Stroke Father     The following portions of the patient's history were reviewed and updated as appropriate: allergies, current medications, past family history, past medical history, past social history, past surgical history and problem list.  Review of Systems Pertinent items noted in HPI and remainder of comprehensive ROS otherwise negative.   Objective:  BP (!) 171/114   Pulse 67   Wt 139 lb (63 kg)   LMP 02/18/2021   BMI 27.15 kg/m  CONSTITUTIONAL: Well-developed, well-nourished female in no acute distress.  HENT:  Normocephalic, atraumatic, External right and left ear normal.  EYES: Conjunctivae and EOM are normal. Pupils are equal, round.  No scleral icterus.  NECK: Normal range of motion, supple, no masses.  Normal thyroid.  SKIN: Skin is warm and dry. No rash noted. Not diaphoretic. No erythema. No pallor. NEUROLOGIC: Alert and oriented to person, place, and time. Normal reflexes, muscle tone coordination. No  cranial nerve deficit noted. PSYCHIATRIC: Normal mood and affect. Normal behavior. Normal judgment and thought content. CARDIOVASCULAR: Normal heart rate noted, regular rhythm RESPIRATORY: Clear to auscultation bilaterally. Effort and breath sounds normal, no problems with respiration noted. BREASTS: Symmetric in size. No masses, skin changes, nipple drainage, or lymphadenopathy. ABDOMEN: Soft, no distention noted.  No tenderness, rebound or guarding.  PELVIC: deferred - vaginal swab done MUSCULOSKELETAL: Normal range of motion. No tenderness.  No cyanosis, clubbing, or edema.    Assessment and Plan:  1. Encounter for annual routine gynecological examination Discussed strategies for taking BP medication daily - BP elevated today. Advised she needs to have her PCP recheck her BP when she is on medication - advised her provider is out right now and not able to be seen in the office.  Reviewed establishing with another provider - list of providers given. Reviewed outcomes of capillary damage when BP is high and reinforced taking medication daily Return in one year for annual exam Does not smoke cigarettes or vape - does smoke marijuana Drinks wine daily - sometimes one bottle of "mild" wine - Cooler. Advised to consider decreasing alcohol intake Reports she does drink 64 ounces of water daily.  - Cervicovaginal ancillary only( [place]) - HIV antibody (with reflex) - Hepatitis C Antibody - Hepatitis B Surface AntiGEN - RPR  Mammogram scheduled in August 2022 Routine preventative health maintenance measures emphasized. Please refer to After Visit Summary for other counseling recommendations.    Nolene Bernheim, RN, MSN, NP-BC Nurse Practitioner, [place] [facility] for [facility]

## 2021-02-22 LAB — CERVICOVAGINAL ANCILLARY ONLY
Bacterial Vaginitis (gardnerella): NEGATIVE
Candida Glabrata: NEGATIVE
Candida Vaginitis: NEGATIVE
Chlamydia: NEGATIVE
Comment: NEGATIVE
Comment: NEGATIVE
Comment: NEGATIVE
Comment: NEGATIVE
Comment: NEGATIVE
Comment: NORMAL
Neisseria Gonorrhea: NEGATIVE
Trichomonas: NEGATIVE

## 2021-02-22 LAB — HEPATITIS C ANTIBODY: Hep C Virus Ab: <0.1 s/co ratio (ref 0.0–0.9)

## 2021-02-22 LAB — HEPATITIS B SURFACE ANTIGEN: Hepatitis B Surface Ag: NEGATIVE

## 2021-02-22 LAB — HIV ANTIBODY (ROUTINE TESTING W REFLEX): HIV Screen 4th Generation wRfx: NONREACTIVE

## 2021-02-22 LAB — RPR: RPR Ser Ql: NONREACTIVE

## 2021-03-14 ENCOUNTER — Ambulatory Visit
Admission: RE | Admit: 2021-03-14 | Discharge: 2021-03-14 | Disposition: A | Discharge status: Home / Self Care | Payer: 59 | Source: Ambulatory Visit | Attending: Advanced Practice Midwife | Admitting: Advanced Practice Midwife

## 2021-03-14 ENCOUNTER — Other Ambulatory Visit: Payer: Self-pay

## 2021-03-14 DIAGNOSIS — Z1231 Encounter for screening mammogram for malignant neoplasm of breast: Secondary | ICD-10-CM

## 2021-03-28 ENCOUNTER — Encounter: Payer: 59 | Admitting: Family Medicine

## 2021-04-05 ENCOUNTER — Encounter: Payer: Self-pay | Admitting: Family Medicine

## 2021-05-19 ENCOUNTER — Telehealth: Payer: Self-pay

## 2021-05-19 MED ORDER — AMLODIPINE BESYLATE 5 MG PO TABS: 5.0000 mg | ORAL_TABLET | Freq: Every day | ORAL | 1 refills | Status: DC

## 2021-05-19 NOTE — Telephone Encounter (Signed)
Pt left VM requesting Blood pressure medication

## 2021-08-09 ENCOUNTER — Encounter (HOSPITAL_COMMUNITY): Payer: Self-pay

## 2021-08-09 ENCOUNTER — Other Ambulatory Visit: Payer: Self-pay

## 2021-08-09 ENCOUNTER — Emergency Department (HOSPITAL_COMMUNITY)
Admission: EM | Admit: 2021-08-09 | Discharge: 2021-08-10 | Disposition: A | Discharge status: Home / Self Care | Payer: Commercial Managed Care - HMO | Attending: Emergency Medicine | Admitting: Emergency Medicine

## 2021-08-09 DIAGNOSIS — R200 Anesthesia of skin: Secondary | ICD-10-CM | POA: Insufficient documentation

## 2021-08-09 DIAGNOSIS — M542 Cervicalgia: Secondary | ICD-10-CM | POA: Insufficient documentation

## 2021-08-09 DIAGNOSIS — Z79899 Other long term (current) drug therapy: Secondary | ICD-10-CM | POA: Diagnosis not present

## 2021-08-09 DIAGNOSIS — Y92410 Unspecified street and highway as the place of occurrence of the external cause: Secondary | ICD-10-CM | POA: Insufficient documentation

## 2021-08-09 DIAGNOSIS — R519 Headache, unspecified: Secondary | ICD-10-CM | POA: Insufficient documentation
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
## 2021-08-10 MED ORDER — METHOCARBAMOL 500 MG PO TABS: 500.0000 mg | ORAL_TABLET | Freq: Two times a day (BID) | ORAL | 0 refills | Status: DC

## 2021-08-10 MED ORDER — IBUPROFEN 800 MG PO TABS
800.0000 mg | ORAL_TABLET | Freq: Three times a day (TID) | ORAL | 0 refills | Status: AC
Start: 2021-08-10 — End: ?

## 2021-08-10 NOTE — ED Notes (Signed)
I provided reinforced discharge education based off of discharge instructions. Pt acknowledged and understood my education. Pt had no further questions/concerns for provider/myself.  °
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
## 2021-09-19 ENCOUNTER — Encounter: Payer: Self-pay | Admitting: Physician Assistant

## 2021-09-19 ENCOUNTER — Ambulatory Visit (INDEPENDENT_AMBULATORY_CARE_PROVIDER_SITE_OTHER): Payer: Managed Care, Other (non HMO) | Admitting: Physician Assistant

## 2021-09-19 VITALS — BP 138/82 | HR 89 | Ht 60.0 in | Wt 141.2 lb

## 2021-09-19 DIAGNOSIS — I10 Essential (primary) hypertension: Secondary | ICD-10-CM | POA: Diagnosis not present

## 2021-09-19 DIAGNOSIS — Z0000 Encounter for general adult medical examination without abnormal findings: Secondary | ICD-10-CM | POA: Diagnosis not present

## 2021-09-19 MED ORDER — AMLODIPINE BESYLATE 5 MG PO TABS: 5.0000 mg | ORAL_TABLET | Freq: Every day | ORAL | 2 refills | Status: DC
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
## 2021-09-20 LAB — COMPREHENSIVE METABOLIC PANEL
ALT: 15 IU/L (ref 0–32)
AST: 18 IU/L (ref 0–40)
Albumin/Globulin Ratio: 1.6 (ref 1.2–2.2)
Albumin: 4.5 g/dL (ref 3.8–4.8)
Alkaline Phosphatase: 79 IU/L (ref 44–121)
BUN/Creatinine Ratio: 12 (ref 9–23)
BUN: 8 mg/dL (ref 6–24)
Bilirubin Total: 0.2 mg/dL (ref 0.0–1.2)
CO2: 20 mmol/L (ref 20–29)
Calcium: 9.4 mg/dL (ref 8.7–10.2)
Chloride: 103 mmol/L (ref 96–106)
Creatinine, Ser: 0.68 mg/dL (ref 0.57–1.00)
Globulin, Total: 2.9 g/dL (ref 1.5–4.5)
Glucose: 89 mg/dL (ref 70–99)
Potassium: 4.3 mmol/L (ref 3.5–5.2)
Sodium: 138 mmol/L (ref 134–144)
Total Protein: 7.4 g/dL (ref 6.0–8.5)
eGFR: 111 mL/min/{1.73_m2} (ref >59)

## 2021-09-20 LAB — CBC WITH DIFFERENTIAL/PLATELET
Basophils Absolute: 0 10*3/uL (ref 0.0–0.2)
Basos: 1 %
EOS (ABSOLUTE): 0 10*3/uL (ref 0.0–0.4)
Eos: 1 %
Hematocrit: 38.8 % (ref 34.0–46.6)
Hemoglobin: 13 g/dL (ref 11.1–15.9)
Immature Grans (Abs): 0 10*3/uL (ref 0.0–0.1)
Immature Granulocytes: 0 %
Lymphocytes Absolute: 2.1 10*3/uL (ref 0.7–3.1)
Lymphs: 36 %
MCH: 29.7 pg (ref 26.6–33.0)
MCHC: 33.5 g/dL (ref 31.5–35.7)
MCV: 89 fL (ref 79–97)
Monocytes Absolute: 0.5 10*3/uL (ref 0.1–0.9)
Monocytes: 8 %
Neutrophils Absolute: 3.4 10*3/uL (ref 1.4–7.0)
Neutrophils: 54 %
Platelets: 414 10*3/uL (ref 150–450)
RBC: 4.37 x10E6/uL (ref 3.77–5.28)
RDW: 12.3 % (ref 11.7–15.4)
WBC: 6 10*3/uL (ref 3.4–10.8)

## 2021-09-20 LAB — LIPID PANEL
Chol/HDL Ratio: 3.1 ratio (ref 0.0–4.4)
Cholesterol, Total: 151 mg/dL (ref 100–199)
HDL: 49 mg/dL (ref >39)
LDL Chol Calc (NIH): 88 mg/dL (ref 0–99)
Triglycerides: 74 mg/dL (ref 0–149)
VLDL Cholesterol Cal: 14 mg/dL (ref 5–40)

## 2021-09-20 LAB — TSH+FREE T4
Free T4: 0.89 ng/dL (ref 0.82–1.77)
TSH: 0.682 u[IU]/mL (ref 0.450–4.500)

## 2021-11-29 ENCOUNTER — Other Ambulatory Visit: Payer: Self-pay | Admitting: *Deleted

## 2021-11-29 ENCOUNTER — Encounter: Payer: Self-pay | Admitting: Family Medicine

## 2021-11-29 ENCOUNTER — Telehealth (INDEPENDENT_AMBULATORY_CARE_PROVIDER_SITE_OTHER): Payer: Managed Care, Other (non HMO) | Admitting: Family Medicine

## 2021-11-29 ENCOUNTER — Other Ambulatory Visit: Payer: Managed Care, Other (non HMO)

## 2021-11-29 VITALS — BP 142/96 | HR 88 | Temp 97.6°F | Ht 60.0 in | Wt 148.0 lb

## 2021-11-29 DIAGNOSIS — B349 Viral infection, unspecified: Secondary | ICD-10-CM

## 2021-11-29 DIAGNOSIS — J029 Acute pharyngitis, unspecified: Secondary | ICD-10-CM

## 2021-11-29 DIAGNOSIS — R197 Diarrhea, unspecified: Secondary | ICD-10-CM

## 2021-11-29 DIAGNOSIS — R1110 Vomiting, unspecified: Secondary | ICD-10-CM

## 2021-11-29 DIAGNOSIS — R112 Nausea with vomiting, unspecified: Secondary | ICD-10-CM

## 2021-11-29 DIAGNOSIS — I10 Essential (primary) hypertension: Secondary | ICD-10-CM | POA: Diagnosis not present

## 2021-11-29 NOTE — Patient Instructions (Addendum)
?  Your blood pressure was elevated. Try and limit the sodium in your diet (you have been eating salty foods/soups). ?Avoid phenylephrine, as this will raise your blood pressure (as does pseudoephedrine). ?Consider using Coricidin HBP to help with drainage, vs using a non-sedating allergy medication such as claritin or allegra. ?Use tylenol and/or ibuprofen (with food) as needed for sore throat, along with salt water gargles, lozenges. ? ?Come today for COVID PCR test.  The results should be back tomorrow. ?If it is negative, and you are feeling better, you may return to work. ?

## 2021-11-29 NOTE — Progress Notes (Signed)
Start time: 1:27 ?End time: 1:49 ? ?Virtual Visit via Video Note ? ?I connected with Heidi Molina on 11/29/21 by a video enabled telemedicine application and verified that I am speaking with the correct person using two identifiers. ? ?Location: ?Patient: home ?Provider: office ?  ?I discussed the limitations of evaluation and management by telemedicine and the availability of in person appointments. The patient expressed understanding and agreed to proceed. ? ?History of Present Illness: ? ?Chief Complaint  ?Patient presents with  ? Emesis  ?  VIRTUAL vomiting, ST and lightheadness that started yesterday. She works at nursing home. Has some diarrhea. Wants to have a covid test. She does have one at home, exp 3/23-I did go ahead and tell her to take the test and I will call her back. Patient did test and it was negative.  ? ? ?Yesterday afternoon she started with ST and runny nose, headache, and also vomiting.  She describes it like a head cold, along with some lightheadedness. Denies any cough. ?Nasal mucus is clear.  Denies sinus pain. ?Throat looks fine, doesn't feel like strep, just still a little sore. ? ?She vomited 4x since yesterday afternoon, last episode was 1am early this morning. ?She has been keeping fluids down today. ?Eating chicken noodle soup and Oodles of Noodles. ? ?She has had more frequent bowel movements than normal, soft, but not watery (4x today). ? ?Nausea is better.   ?Took Alka Selzer plus.  Last night took Theraflu ? ?BF and son aren't sick. ?Works in nursing home.  Some with URI symptoms, some with GI illnesses. No known COVID. ?She does activities with the residents, and was around someone over the weekend who had been ill. ?She took a COVID test today that was negative. ? ?No spoiled or undercooked/raw foods ?No travel, camping ?No recent ABX ? ?COVID vaccines x 2 (initial series only); has had COVID 2x, last was last year. ? ?PMH, PSH, SH reviewed ? ?Outpatient Encounter Medications  as of 11/29/2021  ?Medication Sig Note  ? amLODipine (NORVASC) 5 MG tablet Take 1 tablet (5 mg total) by mouth daily.   ? Ascorbic Acid (VITAMIN C ER PO) Take by mouth.   ? BLACK CURRANT SEED OIL PO Take by mouth.   ? COD LIVER OIL PO Take by mouth.   ? CRANBERRY PO Take by mouth.   ? Multiple Vitamin (MULTIVITAMIN) tablet Take 1 tablet by mouth daily.   ? ibuprofen (ADVIL) 800 MG tablet Take 1 tablet (800 mg total) by mouth 3 (three) times daily. (Patient not taking: Reported on 11/29/2021) 11/29/2021: prn  ? methocarbamol (ROBAXIN) 500 MG tablet Take 1 tablet (500 mg total) by mouth 2 (two) times daily. (Patient not taking: Reported on 11/29/2021) 11/29/2021: prn  ? ?No facility-administered encounter medications on file as of 11/29/2021.  ? ?No Known Allergies ? ?ROS:  No f/c, no urinary complaints. ?V/d per HPI,  ?URI symptoms per HPI. ?No cough, chest pain, shortness of breath. ?See HPI ? ?  ?Observations/Objective: ? ?BP (!) 142/96   Pulse 88   Temp 97.6 ?F (36.4 ?C) (Temporal)   Ht 5' (1.524 m)   Wt 148 lb (67.1 kg)   LMP 11/26/2021 (Exact Date)   BMI 28.90 kg/m?  ? ?Tired-appearing female (laying down), alert and oriented, in no distress ?HEENT: conjunctiva and sclera are clear, cranial nerves grossly intact. ?Normal speech. No sniffling, throat-clearing or coughing during visit. ?Exam is limited due to virtual nature of the visit. ? ? ?  Assessment and Plan: ? ?Viral syndrome - cannot r/o COVID, rapid test is early in course.  Pt to come today for PCR. Supportive measures reviewed ? ?Essential hypertension, benign - BP elevated today--poss from decongestants, vs high sodium food/soup intake. To cut back on these and monitor ? ?Nausea and vomiting, unspecified vomiting type - vomiting has subsided, tolerating fluids. N is very mild.  No meds needed at this point. ? ?Your blood pressure was elevated. Try and limit the sodium in your diet (you have been eating salty foods/soups). ?Avoid phenylephrine, as this  will raise your blood pressure (as does pseudoephedrine). ?Consider using Coricidin HBP to help with drainage, vs using a non-sedating allergy medication such as claritin or allegra. ?Use tylenol and/or ibuprofen (with food) as needed for sore throat, along with salt water gargles, lozenges. ? ?Come today for COVID PCR test.  The results should be back tomorrow. ?If it is negative, and you are feeling better, you may return to work. ? ? ?Note written--OOW today and tomorrow. ? ?Follow Up Instructions: ? ?  ?I discussed the assessment and treatment plan with the patient. The patient was provided an opportunity to ask questions and all were answered. The patient agreed with the plan and demonstrated an understanding of the instructions. ?  ?The patient was advised to call back or seek an in-person evaluation if the symptoms worsen or if the condition fails to improve as anticipated. ? ?I spent 25 minutes dedicated to the care of this patient, including pre-visit review of records, face to face time, post-visit ordering of testing and documentation. ? ? ? ?Lavonda Jumbo, MD ?

## 2021-11-30 ENCOUNTER — Other Ambulatory Visit: Payer: Managed Care, Other (non HMO)

## 2021-11-30 DIAGNOSIS — R197 Diarrhea, unspecified: Secondary | ICD-10-CM

## 2021-11-30 DIAGNOSIS — R1110 Vomiting, unspecified: Secondary | ICD-10-CM

## 2021-11-30 DIAGNOSIS — J029 Acute pharyngitis, unspecified: Secondary | ICD-10-CM

## 2021-12-01 LAB — NOVEL CORONAVIRUS, NAA: SARS-CoV-2, NAA: NOT DETECTED

## 2021-12-11 IMAGING — MG MM DIGITAL SCREENING BILAT W/ TOMO AND CAD
6 of 10 series · 6 of 30 positions shown · non-contrast
Comparison: Previous exam(s).

CLINICAL DATA: Screening.

EXAM:
DIGITAL SCREENING BILATERAL MAMMOGRAM WITH TOMOSYNTHESIS AND CAD
TECHNIQUE: Bilateral screening digital craniocaudal and mediolateral oblique
mammograms were obtained. Bilateral screening digital breast
tomosynthesis was performed. The images were evaluated with
computer-aided detection.

[R MLO synth-2D (1 of 2)]
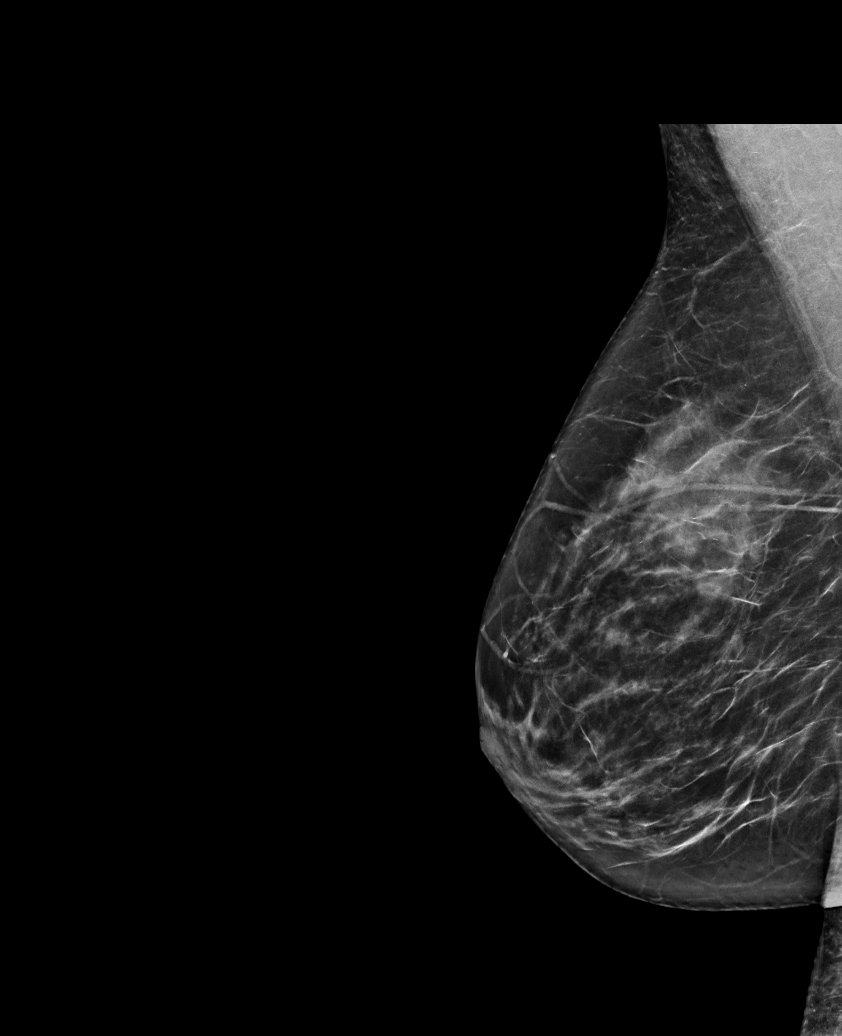

[R CC synth-2D]
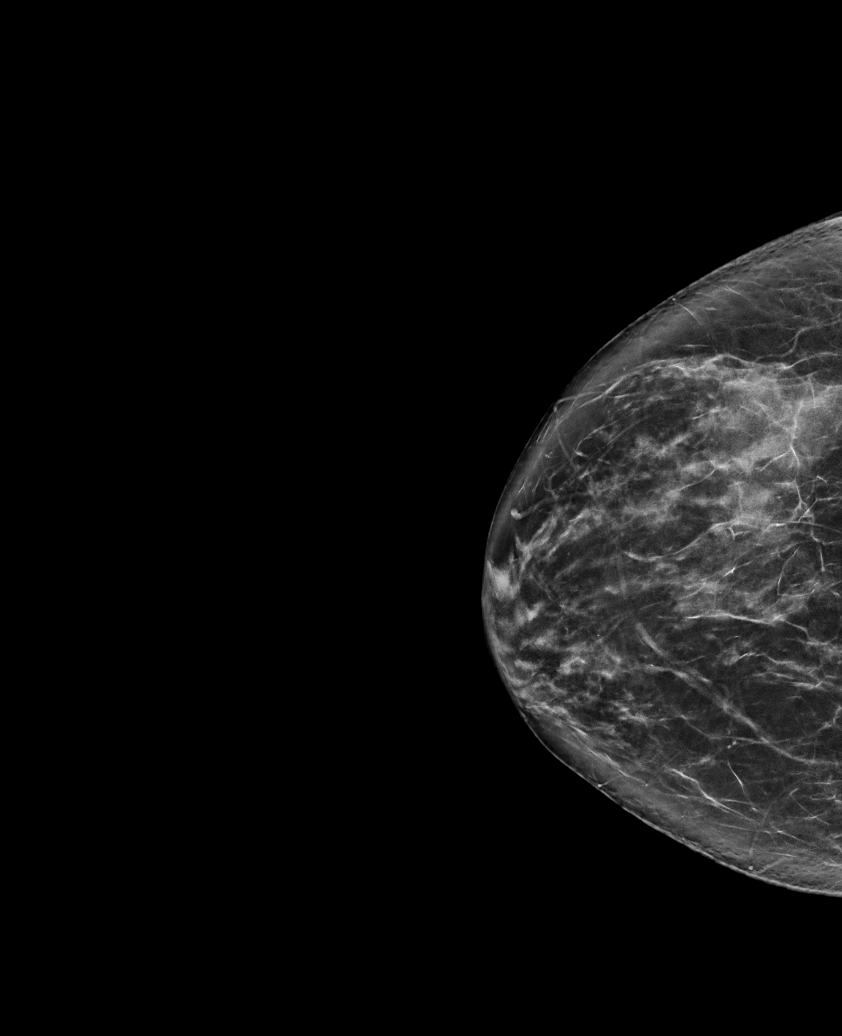

[R MLO synth-2D (2 of 2)]
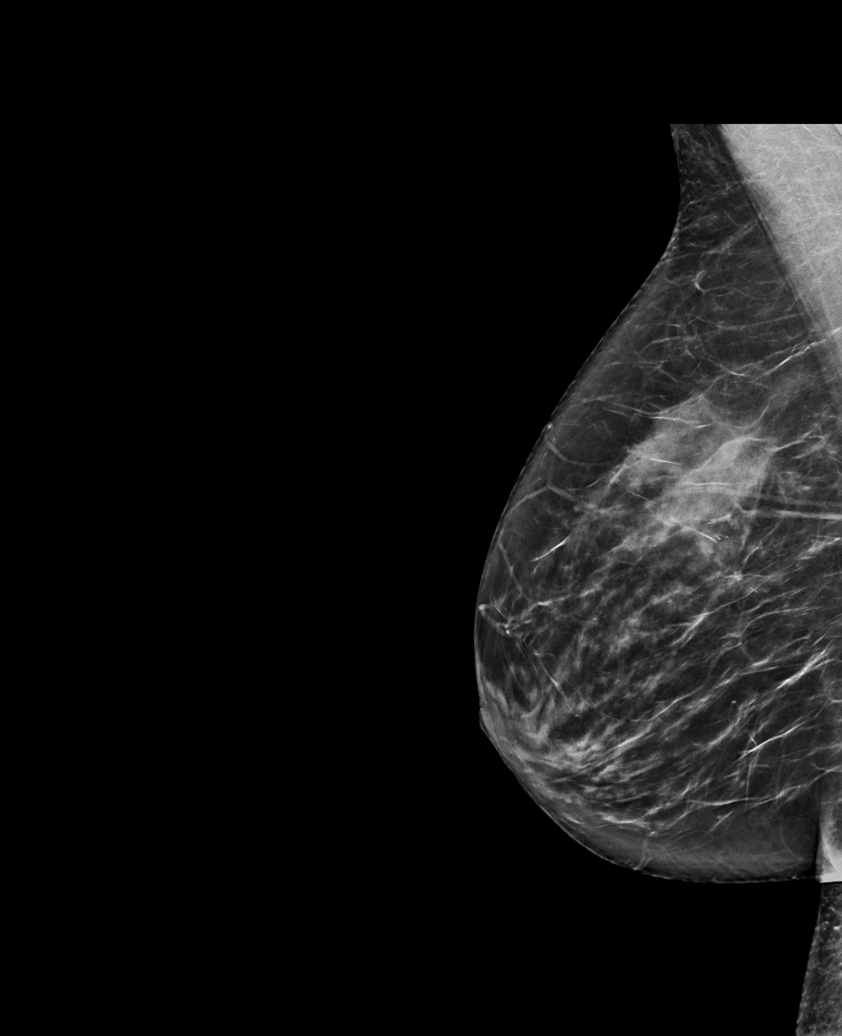

[L CC synth-2D]
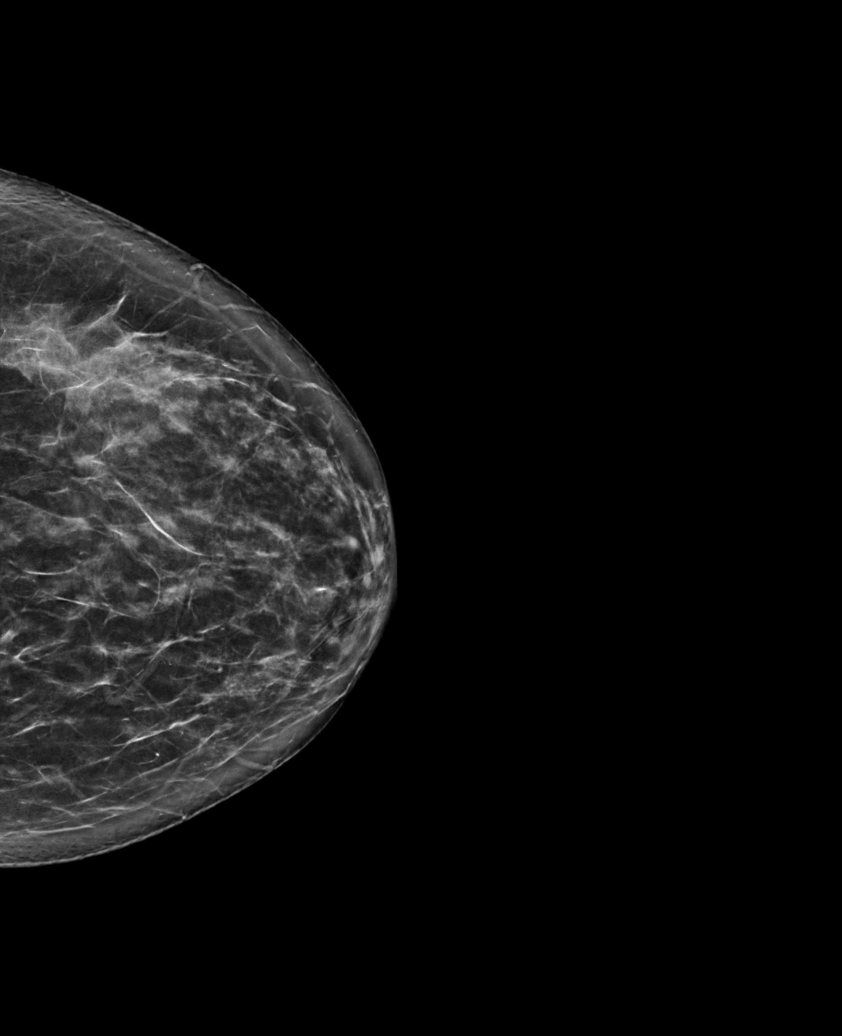

[L MLO synth-2D]
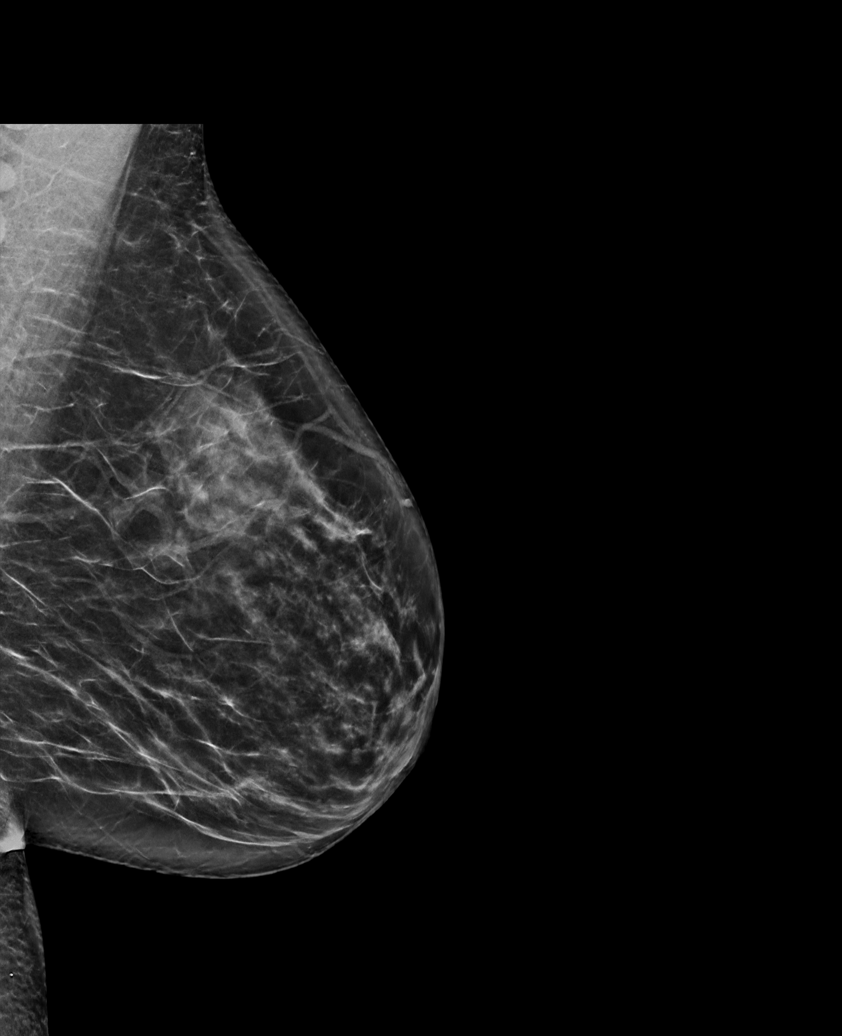

[L MLO tomo · tomo slice 38/75.0]
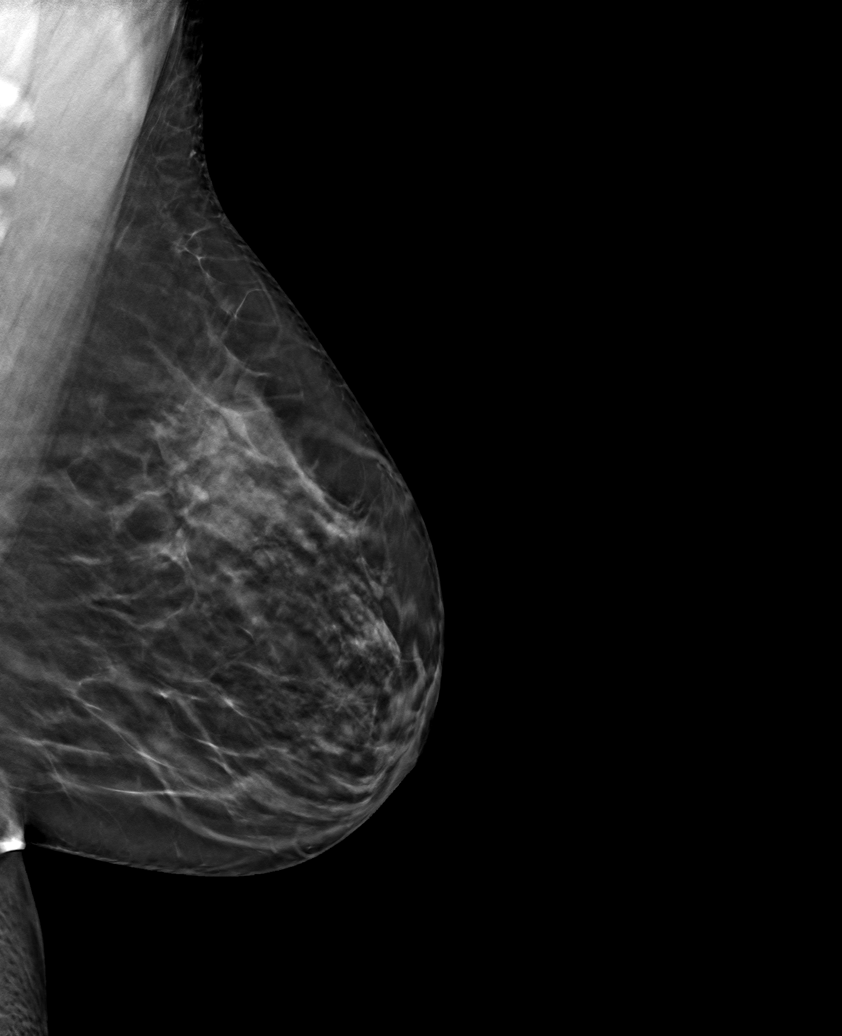

[6 of 30 positions shown; findings below may reference images not displayed]

ACR Breast Density Category c: The breast tissue is heterogeneously
dense, which may obscure small masses.
FINDINGS: There are no findings suspicious for malignancy.
IMPRESSION: No mammographic evidence of malignancy. A result letter of this
screening mammogram will be mailed directly to the patient.

RECOMMENDATION:
Screening mammogram in one year. (Code:Q3-W-BC3)

BI-RADS CATEGORY  1: Negative.

## 2022-02-02 ENCOUNTER — Encounter: Payer: Self-pay | Admitting: Internal Medicine

## 2022-04-11 ENCOUNTER — Encounter: Payer: Self-pay | Admitting: Internal Medicine

## 2022-04-25 ENCOUNTER — Telehealth: Payer: Self-pay | Admitting: Medical

## 2022-04-25 ENCOUNTER — Telehealth: Payer: Self-pay | Admitting: Physician Assistant

## 2022-04-25 DIAGNOSIS — Z23 Encounter for immunization: Secondary | ICD-10-CM

## 2022-04-25 DIAGNOSIS — Z111 Encounter for screening for respiratory tuberculosis: Secondary | ICD-10-CM

## 2022-04-25 NOTE — Telephone Encounter (Signed)
error 

## 2022-04-25 NOTE — Telephone Encounter (Signed)
Pt called and wants to know if she is due for any vaccines. If so she wants to be scheduled to come in and get whatever is needed.

## 2022-04-30 NOTE — Telephone Encounter (Signed)
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 She is not planning to become pregnant.

## 2022-04-30 NOTE — Telephone Encounter (Signed)
Ive put in future orders

## 2022-05-01 ENCOUNTER — Other Ambulatory Visit (INDEPENDENT_AMBULATORY_CARE_PROVIDER_SITE_OTHER): Payer: Commercial Managed Care - HMO

## 2022-05-01 DIAGNOSIS — Z23 Encounter for immunization: Secondary | ICD-10-CM

## 2022-05-01 DIAGNOSIS — Z111 Encounter for screening for respiratory tuberculosis: Secondary | ICD-10-CM

## 2022-05-04 ENCOUNTER — Other Ambulatory Visit (INDEPENDENT_AMBULATORY_CARE_PROVIDER_SITE_OTHER): Payer: Commercial Managed Care - HMO

## 2022-05-04 DIAGNOSIS — Z23 Encounter for immunization: Secondary | ICD-10-CM

## 2022-05-05 LAB — QUANTIFERON-TB GOLD PLUS
QuantiFERON Mitogen Value: >10 IU/mL
QuantiFERON Nil Value: 0.01 IU/mL
QuantiFERON TB1 Ag Value: 0.03 IU/mL
QuantiFERON TB2 Ag Value: 0.03 IU/mL
QuantiFERON-TB Gold Plus: NEGATIVE

## 2022-05-08 ENCOUNTER — Telehealth: Payer: Self-pay | Admitting: Medical

## 2022-05-08 NOTE — Telephone Encounter (Signed)
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 She also wants a copy emailed to [email or web address].

## 2022-05-08 NOTE — Telephone Encounter (Signed)
I have emailed to her

## 2022-05-15 ENCOUNTER — Encounter: Payer: Self-pay | Admitting: Internal Medicine

## 2022-06-19 ENCOUNTER — Encounter: Payer: Self-pay | Admitting: Internal Medicine

## 2022-12-07 ENCOUNTER — Ambulatory Visit (INDEPENDENT_AMBULATORY_CARE_PROVIDER_SITE_OTHER): Payer: Commercial Managed Care - HMO | Admitting: Nurse Practitioner

## 2022-12-07 ENCOUNTER — Encounter: Payer: Self-pay | Admitting: Nurse Practitioner

## 2022-12-07 VITALS — BP 140/92 | HR 86 | Wt 139.4 lb

## 2022-12-07 DIAGNOSIS — I10 Essential (primary) hypertension: Secondary | ICD-10-CM

## 2022-12-07 DIAGNOSIS — E559 Vitamin D deficiency, unspecified: Secondary | ICD-10-CM

## 2022-12-07 DIAGNOSIS — F411 Generalized anxiety disorder: Secondary | ICD-10-CM | POA: Diagnosis not present

## 2022-12-07 DIAGNOSIS — M5481 Occipital neuralgia: Secondary | ICD-10-CM | POA: Diagnosis not present

## 2022-12-07 DIAGNOSIS — R7303 Prediabetes: Secondary | ICD-10-CM

## 2022-12-07 MED ORDER — AMLODIPINE-VALSARTAN-HCTZ 5-160-12.5 MG PO TABS: 1.0000 | ORAL_TABLET | Freq: Every day | ORAL | 1 refills | Status: DC

## 2022-12-07 MED ORDER — CYCLOBENZAPRINE HCL 5 MG PO TABS
5.0000 mg | ORAL_TABLET | Freq: Every evening | ORAL | 1 refills | Status: DC | PRN
Start: 2022-12-07 — End: 2023-03-26

## 2022-12-07 MED ORDER — BUSPIRONE HCL 10 MG PO TABS: 10.0000 mg | ORAL_TABLET | Freq: Two times a day (BID) | ORAL | 1 refills | Status: DC | PRN

## 2022-12-07 NOTE — Patient Instructions (Signed)
Follow-up with me in about 4 weeks to see how your stress is going and your blood pressure looks.   Call me before this if your blood pressure does not get under 130/80

## 2022-12-07 NOTE — Progress Notes (Signed)
Heidi Eth, DNP, AGNP-c [facility] [address] [place], [place] [address] [phone]  Subjective:   Heidi Molina is a 42 y.o. female presents to day for evaluation of: high blood pressure accompanied by headaches, neck pain, dizziness, and a general feeling of bodily discomfort since Monday. She reports that these symptoms have persisted despite her regular intake of amlodipine, a medication prescribed for her blood pressure. The patient denies any recent changes that could potentially exacerbate her blood pressure but acknowledges the need for a medication refill.  She also describes symptoms of occipital neuralgia, which she relates to stress and poor sleep. Heidi Molina notes increased stress levels due to her daughter moving back in and the presence of her eight-month-old grandchild, which has also affected her sleep quality.  Regarding her hydration habits, the patient states that she drinks a significant amount of water, including [place] clear and alkaline water.   She also mentions past experiences with anxiety and previous use of anxiety medication, although she cannot recall the specific medication. Quintana expresses interest in trying new treatment options to manage her symptoms.  PMH, Medications, and Allergies reviewed and updated in chart as appropriate.   ROS negative except for what is listed in HPI. Objective:  BP (!) 140/92   Pulse 86   Wt 139 lb 6.4 oz (63.2 kg)   SpO2 99%   BMI 27.22 kg/m  Physical Exam Vitals and nursing note reviewed.  Constitutional:      Appearance: Normal appearance.  HENT:     Head: Normocephalic.  Eyes:     Pupils: Pupils are equal, round, and reactive to light.  Neck:     Vascular: No carotid bruit.  Cardiovascular:     Rate and Rhythm: Normal rate and regular rhythm.     Pulses: Normal pulses.     Heart sounds: Normal heart sounds.  Pulmonary:     Effort: Pulmonary effort is normal.     Breath sounds:  Normal breath sounds.  Musculoskeletal:        General: Normal range of motion.     Cervical back: Normal range of motion.     Right lower leg: No edema.     Left lower leg: No edema.  Skin:    General: Skin is warm and dry.     Capillary Refill: Capillary refill takes less than 2 seconds.  Neurological:     General: No focal deficit present.     Mental Status: She is alert and oriented to person, place, and time.  Psychiatric:        Mood and Affect: Mood normal.           Assessment & Plan:   Problem List Items Addressed This Visit     Essential hypertension, benign - Primary    Blood pressure not well controlled on current regimen. Discussion with patient on the change in medication regimen to help achieve improved control. Plan: - Change BP medication to Amlodipine-Valsartan-HCTZ - Monitor BP at home and report if readings are consistently higher than 130/80      Relevant Medications   amLODIPine-Valsartan-HCTZ 5-160-12.5 MG TABS   Other Relevant Orders   Hemoglobin A1c (Completed)   CBC with Differential/Platelet (Completed)   Comprehensive metabolic panel (Completed)   VITAMIN D 25 Hydroxy (Vit-D Deficiency, Fractures) (Completed)   Generalized anxiety disorder    Increased anxiety symptoms with cervical muscle tension and headaches. Suspect this is correlated.  Plan: - PRN flexeril for muscle tension and  headache reduction - Add buspar daily for management of anxiety symptoms. Avoid use of benzodiazepines due to history.       Relevant Medications   busPIRone (BUSPAR) 10 MG tablet   Other Relevant Orders   Hemoglobin A1c (Completed)   CBC with Differential/Platelet (Completed)   Comprehensive metabolic panel (Completed)   VITAMIN D 25 Hydroxy (Vit-D Deficiency, Fractures) (Completed)   Bilateral occipital neuralgia    Cervical neck pain with tension present with headaches starting in occipital region. No alarm or neurological symptoms present on exam.  Unclear if her blood pressure elevation is contributing at this time. Discussed stress reduction and medication management to help relieve symptoms.  Plan: - Will change blood pressure medication to include Valsartan for improved BP management - Will add PRN flexeril for tension and spasms in the cervical spine to aid in reduction of headaches - Will work to improve anxiety and stress with Buspar and relaxation      Relevant Medications   busPIRone (BUSPAR) 10 MG tablet   cyclobenzaprine (FLEXERIL) 5 MG tablet   Other Relevant Orders   Hemoglobin A1c (Completed)   CBC with Differential/Platelet (Completed)   Comprehensive metabolic panel (Completed)   VITAMIN D 25 Hydroxy (Vit-D Deficiency, Fractures) (Completed)   Other Visit Diagnoses     Vitamin D deficiency       Relevant Medications   Cholecalciferol (VITAMIN D3) 1.25 MG (50000 UT) TABS   Other Relevant Orders   Hemoglobin A1c (Completed)   CBC with Differential/Platelet (Completed)   Comprehensive metabolic panel (Completed)   VITAMIN D 25 Hydroxy (Vit-D Deficiency, Fractures) (Completed)   VITAMIN D 25 Hydroxy (Vit-D Deficiency, Fractures)   Prediabetes       Relevant Orders   Hemoglobin A1c         Heidi Eth, DNP, AGNP-c 12/23/2022  4:08 PM    History, Medications, Surgery, SDOH, and Family History reviewed and updated as appropriate.

## 2022-12-08 LAB — COMPREHENSIVE METABOLIC PANEL
ALT: 16 IU/L (ref 0–32)
AST: 15 IU/L (ref 0–40)
Albumin/Globulin Ratio: 1.7 (ref 1.2–2.2)
Albumin: 4.6 g/dL (ref 3.9–4.9)
Alkaline Phosphatase: 85 IU/L (ref 44–121)
BUN/Creatinine Ratio: 9 (ref 9–23)
BUN: 6 mg/dL (ref 6–24)
Bilirubin Total: 0.3 mg/dL (ref 0.0–1.2)
CO2: 17 mmol/L — ABNORMAL LOW (ref 20–29)
Calcium: 9.8 mg/dL (ref 8.7–10.2)
Chloride: 104 mmol/L (ref 96–106)
Creatinine, Ser: 0.65 mg/dL (ref 0.57–1.00)
Globulin, Total: 2.7 g/dL (ref 1.5–4.5)
Glucose: 82 mg/dL (ref 70–99)
Potassium: 4.1 mmol/L (ref 3.5–5.2)
Sodium: 140 mmol/L (ref 134–144)
Total Protein: 7.3 g/dL (ref 6.0–8.5)
eGFR: 112 mL/min/{1.73_m2} (ref >59)

## 2022-12-08 LAB — CBC WITH DIFFERENTIAL/PLATELET
Basophils Absolute: 0 10*3/uL (ref 0.0–0.2)
Basos: 1 %
EOS (ABSOLUTE): 0 10*3/uL (ref 0.0–0.4)
Eos: 0 %
Hematocrit: 39.6 % (ref 34.0–46.6)
Hemoglobin: 13.2 g/dL (ref 11.1–15.9)
Immature Grans (Abs): 0 10*3/uL (ref 0.0–0.1)
Immature Granulocytes: 0 %
Lymphocytes Absolute: 2.2 10*3/uL (ref 0.7–3.1)
Lymphs: 33 %
MCH: 29.5 pg (ref 26.6–33.0)
MCHC: 33.3 g/dL (ref 31.5–35.7)
MCV: 88 fL (ref 79–97)
Monocytes Absolute: 0.4 10*3/uL (ref 0.1–0.9)
Monocytes: 7 %
Neutrophils Absolute: 3.9 10*3/uL (ref 1.4–7.0)
Neutrophils: 59 %
Platelets: 398 10*3/uL (ref 150–450)
RBC: 4.48 x10E6/uL (ref 3.77–5.28)
RDW: 13.1 % (ref 11.7–15.4)
WBC: 6.5 10*3/uL (ref 3.4–10.8)

## 2022-12-08 LAB — HEMOGLOBIN A1C
Est. average glucose Bld gHb Est-mCnc: 120 mg/dL
Hgb A1c MFr Bld: 5.8 % — ABNORMAL HIGH (ref 4.8–5.6)

## 2022-12-08 LAB — VITAMIN D 25 HYDROXY (VIT D DEFICIENCY, FRACTURES): Vit D, 25-Hydroxy: 16.6 ng/mL — ABNORMAL LOW (ref 30.0–100.0)

## 2022-12-18 MED ORDER — VITAMIN D3 1.25 MG (50000 UT) PO TABS: 1.0000 | ORAL_TABLET | ORAL | 1 refills | Status: DC

## 2022-12-23 ENCOUNTER — Encounter: Payer: Self-pay | Admitting: Nurse Practitioner

## 2022-12-23 DIAGNOSIS — F411 Generalized anxiety disorder: Secondary | ICD-10-CM | POA: Insufficient documentation

## 2022-12-23 NOTE — Assessment & Plan Note (Signed)
Blood pressure not well controlled on current regimen. Discussion with patient on the change in medication regimen to help achieve improved control. Plan: - Change BP medication to Amlodipine-Valsartan-HCTZ - Monitor BP at home and report if readings are consistently higher than 130/80

## 2022-12-23 NOTE — Assessment & Plan Note (Signed)
Cervical neck pain with tension present with headaches starting in occipital region. No alarm or neurological symptoms present on exam. Unclear if her blood pressure elevation is contributing at this time. Discussed stress reduction and medication management to help relieve symptoms.  Plan: - Will change blood pressure medication to include Valsartan for improved BP management - Will add PRN flexeril for tension and spasms in the cervical spine to aid in reduction of headaches - Will work to improve anxiety and stress with Buspar and relaxation

## 2022-12-23 NOTE — Assessment & Plan Note (Signed)
Increased anxiety symptoms with cervical muscle tension and headaches. Suspect this is correlated.  Plan: - PRN flexeril for muscle tension and headache reduction - Add buspar daily for management of anxiety symptoms. Avoid use of benzodiazepines due to history.

## 2023-03-13 ENCOUNTER — Telehealth: Payer: Self-pay | Admitting: Nurse Practitioner

## 2023-03-13 ENCOUNTER — Other Ambulatory Visit: Payer: Self-pay

## 2023-03-13 DIAGNOSIS — I10 Essential (primary) hypertension: Secondary | ICD-10-CM

## 2023-03-13 MED ORDER — AMLODIPINE-VALSARTAN-HCTZ 5-160-12.5 MG PO TABS: 1.0000 | ORAL_TABLET | Freq: Every day | ORAL | 1 refills | Status: DC

## 2023-03-13 NOTE — Telephone Encounter (Signed)
Pt requesting a refill on amlodipine to [id number] - [place], [place] - [address]

## 2023-03-20 ENCOUNTER — Telehealth: Payer: Self-pay | Admitting: Nurse Practitioner

## 2023-03-20 NOTE — Telephone Encounter (Signed)
Pt called re bp med rx  she wants rx for "old bp med" amlodipine 5mg   She states that new med your prescribed back in May makes her sleepy so she has been taking old Rx ( pt did state she has been taking med off and on when she remembered)  I did set her up for BP follow up next week, she was supposed to come back in 4 weeks from last visit. I also let her know that if she has any issues with medications prescribed for her that she needs to let her doctor know so that it can be addressed and changes made if needed

## 2023-03-21 ENCOUNTER — Encounter: Payer: Self-pay | Admitting: Nurse Practitioner

## 2023-03-21 ENCOUNTER — Telehealth: Payer: Self-pay | Admitting: Nurse Practitioner

## 2023-03-21 NOTE — Telephone Encounter (Signed)
Spoke to pt and advised her of Heidi Molina's recommendations, she stated she did not understand why she could not just call her in rx, advised her that per Minna Merritts it is not safe for her to assume what is needed without doing an evaluation and possible labs. Pt was advised to take/continue new bp meds that she prescribed back in May. Patient has appointment on Tuesday 8/20 and I advised her that I would call if cancellation for tomorrow am, pt not available for afternoon appt.

## 2023-03-21 NOTE — Telephone Encounter (Signed)
Heidi Molina called again today about previous message. She says she is now out of the old bp meds and its making her have bad headaches, nausea, and more tired.

## 2023-03-21 NOTE — Telephone Encounter (Signed)
PLease call patient.   She definitely needs to have an evaluation if she is having dizziness and "not feeling right".   I am unable to tell if these symptoms are related to her blood pressure being high, being too low, or due to something completely different.   Unfortunately, I cannot appropriately determine what the problem is without an evaluation and labs. It is not safe for me to assume and make changes without further evaluation and information.   If she is having headaches, dizziness, and other concerning symptoms, I strongly suggest she see someone in the office for an acute visit or go to the urgent care for evaluation. Vincenza Hews may have an acute slot tomorrow.

## 2023-03-21 NOTE — Telephone Encounter (Signed)
Pt just left message with answering service during lunchand stated that new bp medication making her not feel right and making her dizz

## 2023-03-26 ENCOUNTER — Encounter: Payer: Self-pay | Admitting: Nurse Practitioner

## 2023-03-26 ENCOUNTER — Ambulatory Visit (INDEPENDENT_AMBULATORY_CARE_PROVIDER_SITE_OTHER): Payer: Commercial Managed Care - HMO | Admitting: Nurse Practitioner

## 2023-03-26 VITALS — BP 132/98 | HR 64 | Wt 132.6 lb

## 2023-03-26 DIAGNOSIS — M5481 Occipital neuralgia: Secondary | ICD-10-CM

## 2023-03-26 DIAGNOSIS — I10 Essential (primary) hypertension: Secondary | ICD-10-CM

## 2023-03-26 DIAGNOSIS — F411 Generalized anxiety disorder: Secondary | ICD-10-CM | POA: Diagnosis not present

## 2023-03-26 MED ORDER — CYCLOBENZAPRINE HCL 5 MG PO TABS: 5.0000 mg | ORAL_TABLET | Freq: Every evening | ORAL | 3 refills | Status: AC | PRN

## 2023-03-26 MED ORDER — AMLODIPINE BESYLATE 10 MG PO TABS: 10.0000 mg | ORAL_TABLET | Freq: Every day | ORAL | 3 refills | Status: DC

## 2023-03-26 NOTE — Assessment & Plan Note (Addendum)
Chronic hypertensive disorder not currently well-controlled.  Blood pressure is elevated in the office today.  It does appear that her diastolic pressure is fairly difficult to treat.  We had changed her from amlodipine to a combination medication however this did cause dizziness.  It does not appear that she was hypotensive based on the readings that she presented today however given the side effects we will not plan to restart that medication.  She does report that she has tolerated the amlodipine well on its own.  We will plan to increase the amlodipine to 10 mg and continue to monitor her blood pressures at home to see if we can get improved control.  Patient aware to follow-up in 4 to 6 weeks and notify immediately if the medication is causing severe side effects or new symptoms develop.

## 2023-03-26 NOTE — Patient Instructions (Addendum)
Flu season is approaching. I  suggest receiving the flu vaccine by the end of October to ensure that your body has time to respond to the vaccine before our active flu season begins.  Flu vaccines are available now.   Vaccines may be received at any visit or a nurse visit can be scheduled at your convenience. Flu vaccines may also be obtained at your preferred pharmacy.  *If you have your flu vaccine completed at your pharmacy, please ask they fax the record to [phone] so we can update your record.   A new COVID vaccine is expected to be released in September. This will help cover the new variants of the COVID-19 virus and is recommended.   ** COVID is on the rise again in our area. Please use caution when in large groups. If you begin feeling ill with upper respiratory symptoms, I strongly encourage COVID testing. If your initial test is negative, but symptoms persist, second testing is recommended 2-3 days after the first test.   We offer testing by appointment as a drive up service for our patients. If you have symptoms and would like to have testing, please call the office and arrange for this. We can follow-up with a virtual visit with one of our providers for management.    I will restart your amlodipine but increase to 10mg  a day. Keep checking your blood pressures at home. We really want the top number to be between 110-120 and the bottom number to be between 65-80. If the numbers are staying higher than that please let me know.   Please keep me updated on how this is working. If your blood pressures are doing good, then we will plan to repeat labs in 6 months from now.   Managing Your Hypertension Hypertension, also called high blood pressure, is when the force of the blood pressing against the walls of the arteries is too strong. Arteries are blood vessels that carry blood from your heart throughout your body. Hypertension forces the heart to work harder to pump blood and may cause  the arteries to become narrow or stiff. Understanding blood pressure readings A blood pressure reading includes a higher number over a lower number: The first, or top, number is called the systolic pressure. It is a measure of the pressure in your arteries as your heart beats. The second, or bottom number, is called the diastolic pressure. It is a measure of the pressure in your arteries as the heart relaxes. For most people, a normal blood pressure is below 120/80. Your personal target blood pressure may vary depending on your medical conditions, your age, and other factors. Blood pressure is classified into four stages. Based on your blood pressure reading, your health care provider may use the following stages to determine what type of treatment you need, if any. Systolic pressure and diastolic pressure are measured in a unit called millimeters of mercury (mmHg). Normal Systolic pressure: below 120. Diastolic pressure: below 80. Elevated Systolic pressure: 120-129. Diastolic pressure: below 80. Hypertension stage 1 Systolic pressure: 130-139. Diastolic pressure: 80-89. Hypertension stage 2 Systolic pressure: 140 or above. Diastolic pressure: 90 or above. How can this condition affect me? Managing your hypertension is very important. Over time, hypertension can damage the arteries and decrease blood flow to parts of the body, including the brain, heart, and kidneys. Having untreated or uncontrolled hypertension can lead to: A heart attack. A stroke. A weakened blood vessel (aneurysm). Heart failure. Kidney damage. Eye damage. Memory and  concentration problems. Vascular dementia. What actions can I take to manage this condition? Hypertension can be managed by making lifestyle changes and possibly by taking medicines. Your health care provider will help you make a plan to bring your blood pressure within a normal range. You may be referred for counseling on a healthy diet and physical  activity. Nutrition  Eat a diet that is high in fiber and potassium, and low in salt (sodium), added sugar, and fat. An example eating plan is called the DASH diet. DASH stands for Dietary Approaches to Stop Hypertension. To eat this way: Eat plenty of fresh fruits and vegetables. Try to fill one-half of your plate at each meal with fruits and vegetables. Eat whole grains, such as whole-wheat pasta, brown rice, or whole-grain bread. Fill about one-fourth of your plate with whole grains. Eat low-fat dairy products. Avoid fatty cuts of meat, processed or cured meats, and poultry with skin. Fill about one-fourth of your plate with lean proteins such as fish, chicken without skin, beans, eggs, and tofu. Avoid pre-made and processed foods. These tend to be higher in sodium, added sugar, and fat. Reduce your daily sodium intake. Many people with hypertension should eat less than 1,500 mg of sodium a day. Lifestyle  Work with your health care provider to maintain a healthy body weight or to lose weight. Ask what an ideal weight is for you. Get at least 30 minutes of exercise that causes your heart to beat faster (aerobic exercise) most days of the week. Activities may include walking, swimming, or biking. Include exercise to strengthen your muscles (resistance exercise), such as weight lifting, as part of your weekly exercise routine. Try to do these types of exercises for 30 minutes at least 3 days a week. Do not use any products that contain nicotine or tobacco. These products include cigarettes, chewing tobacco, and vaping devices, such as e-cigarettes. If you need help quitting, ask your health care provider. Control any long-term (chronic) conditions you have, such as high cholesterol or diabetes. Identify your sources of stress and find ways to manage stress. This may include meditation, deep breathing, or making time for fun activities. Alcohol use Do not drink alcohol if: Your health care  provider tells you not to drink. You are pregnant, may be pregnant, or are planning to become pregnant. If you drink alcohol: Limit how much you have to: 0-1 drink a day for women. 0-2 drinks a day for men. Know how much alcohol is in your drink. In the [place] one drink equals one 12 oz bottle of beer (355 mL), one 5 oz glass of wine (148 mL), or one 1 oz glass of hard liquor (44 mL). Medicines Your health care provider may prescribe medicine if lifestyle changes are not enough to get your blood pressure under control and if: Your systolic blood pressure is 130 or higher. Your diastolic blood pressure is 80 or higher. Take medicines only as told by your health care provider. Follow the directions carefully. Blood pressure medicines must be taken as told by your health care provider. The medicine does not work as well when you skip doses. Skipping doses also puts you at risk for problems. Monitoring Before you monitor your blood pressure: Do not smoke, drink caffeinated beverages, or exercise within 30 minutes before taking a measurement. Use the bathroom and empty your bladder (urinate). Sit quietly for at least 5 minutes before taking measurements. Monitor your blood pressure at home as told by your health care  provider. To do this: Sit with your back straight and supported. Place your feet flat on the floor. Do not cross your legs. Support your arm on a flat surface, such as a table. Make sure your upper arm is at heart level. Each time you measure, take two or three readings one minute apart and record the results. You may also need to have your blood pressure checked regularly by your health care provider. General information Talk with your health care provider about your diet, exercise habits, and other lifestyle factors that may be contributing to hypertension. Review all the medicines you take with your health care provider because there may be side effects or interactions. Keep all  follow-up visits. Your health care provider can help you create and adjust your plan for managing your high blood pressure. Where to find more information National Heart, Lung, and Blood Institute: [employer].is American Heart Association: www.heart.org Contact a health care provider if: You think you are having a reaction to medicines you have taken. You have repeated (recurrent) headaches. You feel dizzy. You have swelling in your ankles. You have trouble with your vision. Get help right away if: You develop a severe headache or confusion. You have unusual weakness or numbness, or you feel faint. You have severe pain in your chest or abdomen. You vomit repeatedly. You have trouble breathing. These symptoms may be an emergency. Get help right away. Call 911. Do not wait to see if the symptoms will go away. Do not drive yourself to the hospital. Summary Hypertension is when the force of blood pumping through your arteries is too strong. If this condition is not controlled, it may put you at risk for serious complications. Your personal target blood pressure may vary depending on your medical conditions, your age, and other factors. For most people, a normal blood pressure is less than 120/80. Hypertension is managed by lifestyle changes, medicines, or both. Lifestyle changes to help manage hypertension include losing weight, eating a healthy, low-sodium diet, exercising more, stopping smoking, and limiting alcohol. This information is not intended to replace advice given to you by your health care provider. Make sure you discuss any questions you have with your health care provider. Document Revised: 04/06/2021 Document Reviewed: 04/06/2021 Elsevier Patient Education  2024 [employer].

## 2023-03-26 NOTE — Progress Notes (Signed)
Heidi Clamp, DNP, AGNP-c [facility]  [address] [place], [place] [address] [phone]  ESTABLISHED PATIENT- Chronic Health and/or Follow-Up Visit  Blood pressure (!) 132/98, pulse 64, weight 132 lb 9.6 oz (60.1 kg).    Heidi Molina is a 42 y.o. year old female presenting today for evaluation and management of chronic conditions.   HYPERTENSION Current Medications: amlodipine-valsartan-hydrochlorothiazide 5-160-12.5mg  Previously on amlodipine 5mg - at last visit BP was uncontrolled on this medication.  Medication was changed to above regimen given the significant elevations that were seen on the amlodipine alone.. Patient f/u in 4 weeks  did not occur, but she was having symptoms on the new medication. She reports that she stopped the new combination drug and started back on the amlodipine as she did not feel right on the combination medication.  Blood pressure on amlodipine remains elevated.    She has stopped smoking, drinking, and is not using any recreational drugs. She also reports she has stopped drinking caffeine.  Hypertension status: uncontrolled  Satisfied with current treatment? no Duration of hypertension: chronic BP monitoring frequency:  daily BP range: when taking  120/97, 116/92, 120/98 (on combo) BP medication side effects:  yes Medication compliance: out of amlodipine 5mg - not taking other medication Aspirin: no Recurrent headaches: yes Visual changes: no Palpitations: no Dyspnea: no Chest pain: no Lower extremity edema: no Dizzy/lightheaded: yes  Anxiety ANXIETY/STRESS Current medications: buspirone 10mg  BID PRN Medication side effects: none Status: controlled Duration: chronic Anxious mood: no  Current symptoms:  none  Headache/Neck Pain She reports she has had good control with her headache and neck pain with the use of cyclobenzaprine PRN at bedtime and stretching. She has ran out of the cyclobenzaprine and would like a  refill for this.   All ROS negative with exception of what is listed above.   PHYSICAL EXAM Physical Exam Vitals and nursing note reviewed.  Constitutional:      General: She is not in acute distress.    Appearance: Normal appearance.  HENT:     Head: Normocephalic.  Eyes:     Conjunctiva/sclera: Conjunctivae normal.     Pupils: Pupils are equal, round, and reactive to light.  Neck:     Vascular: No carotid bruit.  Cardiovascular:     Rate and Rhythm: Normal rate and regular rhythm.     Pulses: Normal pulses.     Heart sounds: Normal heart sounds. No murmur heard. Pulmonary:     Effort: Pulmonary effort is normal.     Breath sounds: Normal breath sounds.  Musculoskeletal:        General: Normal range of motion.     Cervical back: Normal range of motion.     Right lower leg: No edema.     Left lower leg: No edema.  Skin:    General: Skin is warm and dry.     Capillary Refill: Capillary refill takes less than 2 seconds.  Neurological:     General: No focal deficit present.     Mental Status: She is alert and oriented to person, place, and time.     Motor: No weakness.  Psychiatric:        Mood and Affect: Mood normal.     PLAN Problem List Items Addressed This Visit     Essential hypertension, benign - Primary    Chronic hypertensive disorder not currently well-controlled.  Blood pressure is elevated in the office today.  It does appear that her diastolic pressure is fairly difficult  to treat.  We had changed her from amlodipine to a combination medication however this did cause dizziness.  It does not appear that she was hypotensive based on the readings that she presented today however given the side effects we will not plan to restart that medication.  She does report that she has tolerated the amlodipine well on its own.  We will plan to increase the amlodipine to 10 mg and continue to monitor her blood pressures at home to see if we can get improved control.  Patient  aware to follow-up in 4 to 6 weeks and notify immediately if the medication is causing severe side effects or new symptoms develop.      Relevant Medications   amLODipine (NORVASC) 10 MG tablet   Generalized anxiety disorder    History of generalized anxiety disorder.  She is currently managed with BuSpar 10 mg twice a day as needed and is tolerating this medication well.  She feels that this dosage is appropriate and has no alarm symptoms present at this time.  Will plan to continue the current medication.  Follow-up at least every 6 months for monitoring.      Bilateral occipital neuralgia    Chronic history of neck pain with bilateral occipital headache consistent with occipital neuralgia.  She has had good control with cyclobenzaprine as needed for management of this.  Will provide refill today.      Relevant Medications   cyclobenzaprine (FLEXERIL) 5 MG tablet   amLODipine (NORVASC) 10 MG tablet    Return in about 6 months (around 09/26/2023) for Med Management 30.    Heidi Clamp, DNP, AGNP-c

## 2023-04-03 NOTE — Assessment & Plan Note (Signed)
Chronic history of neck pain with bilateral occipital headache consistent with occipital neuralgia.  She has had good control with cyclobenzaprine as needed for management of this.  Will provide refill today.

## 2023-04-03 NOTE — Assessment & Plan Note (Signed)
History of generalized anxiety disorder.  She is currently managed with BuSpar 10 mg twice a day as needed and is tolerating this medication well.  She feels that this dosage is appropriate and has no alarm symptoms present at this time.  Will plan to continue the current medication.  Follow-up at least every 6 months for monitoring.

## 2023-06-17 ENCOUNTER — Other Ambulatory Visit: Payer: Managed Care, Other (non HMO)

## 2023-09-27 ENCOUNTER — Encounter: Payer: Commercial Managed Care - HMO | Admitting: Nurse Practitioner

## 2024-01-20 ENCOUNTER — Ambulatory Visit (INDEPENDENT_AMBULATORY_CARE_PROVIDER_SITE_OTHER): Admitting: Nurse Practitioner

## 2024-01-20 ENCOUNTER — Encounter: Payer: Self-pay | Admitting: Nurse Practitioner

## 2024-01-20 VITALS — BP 136/88 | HR 94 | Ht 61.0 in | Wt 136.2 lb

## 2024-01-20 DIAGNOSIS — Z0000 Encounter for general adult medical examination without abnormal findings: Secondary | ICD-10-CM | POA: Diagnosis not present

## 2024-01-20 DIAGNOSIS — I10 Essential (primary) hypertension: Secondary | ICD-10-CM

## 2024-01-20 DIAGNOSIS — E559 Vitamin D deficiency, unspecified: Secondary | ICD-10-CM

## 2024-01-20 DIAGNOSIS — R7303 Prediabetes: Secondary | ICD-10-CM | POA: Insufficient documentation

## 2024-01-20 DIAGNOSIS — Z113 Encounter for screening for infections with a predominantly sexual mode of transmission: Secondary | ICD-10-CM

## 2024-01-20 DIAGNOSIS — N951 Menopausal and female climacteric states: Secondary | ICD-10-CM

## 2024-01-20 DIAGNOSIS — Z23 Encounter for immunization: Secondary | ICD-10-CM

## 2024-01-20 MED ORDER — AMLODIPINE BESYLATE 10 MG PO TABS: 10.0000 mg | ORAL_TABLET | Freq: Every day | ORAL | 3 refills | Status: AC

## 2024-01-20 NOTE — Assessment & Plan Note (Signed)
 Experiencing hot flashes, common in perimenopause, which may precede menstrual cycle changes and last for years before menopause is complete. Informed that her experience may be similar to her mother's.

## 2024-01-20 NOTE — Patient Instructions (Addendum)
 Please keep an eye on your blood pressure for me over the next 2 weeks and send me the at home readings. If we need to make any changes to your medications we can do that without you needing to come in. For now, keep taking your amlodipine .     For all adult patients, I recommend A well balanced diet low in saturated fats, cholesterol, and moderation in carbohydrates.   This can be as simple as monitoring portion sizes and cutting back on sugary beverages such as soda and juice to start with.    Daily water consumption of at least 64 ounces.  Physical activity at least 180 minutes per week, if just starting out.   This can be as simple as taking the stairs instead of the elevator and walking 2-3 laps around the office  purposefully every day.   STD protection, partner selection, and regular testing if high risk.  Limited consumption of alcoholic beverages if alcohol is consumed.  For women, I recommend no more than 7 alcoholic beverages per week, spread out throughout the week.  Avoid binge drinking or consuming large quantities of alcohol in one setting.   Please let me know if you feel you may need help with reduction or quitting alcohol consumption.   Avoidance of nicotine , if used.  Please let me know if you feel you may need help with reduction or quitting nicotine  use.   Daily mental health attention.  This can be in the form of 5 minute daily meditation, prayer, journaling, yoga, reflection, etc.   Purposeful attention to your emotions and mental state can significantly improve your overall wellbeing  and  Health.  Please know that I am here to help you with all of your health care goals and am happy to work with you to find a solution that works best for you.  The greatest advice I have received with any changes in life are to take it one step at a time, that even means if all you can focus on is the next 60 seconds, then do that and celebrate your victories.  With any changes  in life, you will have set backs, and that is OK. The important thing to remember is, if you have a set back, it is not a failure, it is an opportunity to try again!  Health Maintenance Recommendations Screening Testing Mammogram Every 1 -2 years based on history and risk factors Starting at age 57 Pap Smear Ages 21-39 every 3 years Ages 48-65 every 5 years with HPV testing More frequent testing may be required based on results and history Colon Cancer Screening Every 1-10 years based on test performed, risk factors, and history Starting at age 18 Bone Density Screening Every 2-10 years based on history Starting at age 76 for women Recommendations for men differ based on medication usage, history, and risk factors AAA Screening One time ultrasound Men 42-71 years old who have every smoked Lung Cancer Screening Low Dose Lung CT every 12 months Age 63-80 years with a 30 pack-year smoking history who still smoke or who have quit within the last 15 years  Screening Labs Routine  Labs: Complete Blood Count (CBC), Complete Metabolic Panel (CMP), Cholesterol (Lipid Panel) Every 6-12 months based on history and medications May be recommended more frequently based on current conditions or previous results Hemoglobin A1c Lab Every 3-12 months based on history and previous results Starting at age 30 or earlier with diagnosis of diabetes, high cholesterol, BMI >26,  and/or risk factors Frequent monitoring for patients with diabetes to ensure blood sugar control Thyroid Panel (TSH w/ T3 & T4) Every 6 months based on history, symptoms, and risk factors May be repeated more often if on medication HIV One time testing for all patients 70 and older May be repeated more frequently for patients with increased risk factors or exposure Hepatitis C One time testing for all patients 18 and older May be repeated more frequently for patients with increased risk factors or exposure Gonorrhea,  Chlamydia Every 12 months for all sexually active persons 13-24 years Additional monitoring may be recommended for those who are considered high risk or who have symptoms PSA Men 32-39 years old with risk factors Additional screening may be recommended from age 69-69 based on risk factors, symptoms, and history  Vaccine Recommendations Tetanus Booster All adults every 10 years Flu Vaccine All patients 6 months and older every year COVID Vaccine All patients 12 years and older Initial dosing with booster May recommend additional booster based on age and health history HPV Vaccine 2 doses all patients age 34-26 Dosing may be considered for patients over 26 Shingles Vaccine (Shingrix) 2 doses all adults 55 years and older Pneumonia (Pneumovax 23) All adults 65 years and older May recommend earlier dosing based on health history Pneumonia (Prevnar 39) All adults 65 years and older Dosed 1 year after Pneumovax 23  Additional Screening, Testing, and Vaccinations may be recommended on an individualized basis based on family history, health history, risk factors, and/or exposure.

## 2024-01-20 NOTE — Assessment & Plan Note (Signed)
 Last levels very low. Previously taking high dose vitamin D  once weekly. Will recheck today.

## 2024-01-20 NOTE — Assessment & Plan Note (Signed)
 Elevated blood glucose levels with a recent A1c of 5.6%. She is motivated to improve her diet and make lifestyle changes to prevent progression to diabetes. Educational materials are provided to support dietary changes. - Provide educational materials on high protein, low carb snack options and a sample meal plan for diabetes management. - Order labs including A1c to monitor blood glucose levels.

## 2024-01-20 NOTE — Assessment & Plan Note (Signed)
 Blood pressure was slightly elevated at the office visit, but home readings are typically around 132/82 mmHg. She experienced a morning headache that resolved after taking her antihypertensive medication. Possible white coat hypertension is suspected due to anxiety in the office setting. - Monitor blood pressure at home over the next few weeks and report readings. - Refill amlodipine  prescription, which is due to run out in August.

## 2024-01-20 NOTE — Assessment & Plan Note (Signed)
 Requests STI testing despite being asymptomatic. Tests will be performed as part of routine health maintenance. - Order STI screening tests.

## 2024-01-20 NOTE — Progress Notes (Signed)
 Dell Fennel, DNP, AGNP-c [facility] [address] [place], [place] [address] Main Office [phone]  BP 136/88   Pulse 94   Ht 5' 1 (1.549 m)   Wt 136 lb 3.2 oz (61.8 kg)   LMP 01/03/2024   SpO2 98%   BMI 25.73 kg/m    Subjective:    Patient ID: Heidi Molina, female    DOB: 1981-04-29, 44 y.o.   MRN: [id number]  History of Present Illness Heidi Molina is a 43 year old female with hypertension who presents with elevated blood pressure and headache.  She usually has blood pressure readings in the 130s, with a recent home reading of 132/82 mmHg. This morning, her blood pressure was elevated, and she experienced a headache that resolved after taking her blood pressure medication. She describes feeling dizzy and different when her blood pressure is high. No chest pain, shortness of breath, or swelling in ankles or feet.  She is concerned about her blood sugar levels, noting a previous reading of 5.6%. She is motivated to change her diet to prevent diabetes.  She experiences severe hot flashes, causing her to sweat and sometimes needing to step outside at work. Her periods are somewhat regular. Her mother experienced severe menopause symptoms, including hot flashes.  She is interested in STI testing despite not having any symptoms.  She works at [employer] and enjoys her job. She does not take Buspar  for anxiety and does not feel the need for it currently.  Pertinent items are noted in HPI.   Most Recent Depression Screen:     01/20/2024    8:57 AM 09/19/2021    1:36 PM 10/16/2019    9:09 AM 09/07/2019    1:46 PM  Depression screen PHQ 2/9  Decreased Interest 0 0 0 0  Down, Depressed, Hopeless 0 1 0 0  PHQ - 2 Score 0 1 0 0  Altered sleeping  0    Tired, decreased energy  1    Change in appetite  0    Feeling bad or failure about yourself   0    Trouble concentrating  0    Moving slowly or fidgety/restless  0    Suicidal thoughts   0    PHQ-9 Score  2    Difficult doing work/chores  Not difficult at all     Most Recent Anxiety Screen:     09/19/2021    1:37 PM  GAD 7 : Generalized Anxiety Score  Nervous, Anxious, on Edge 0  Control/stop worrying 0  Worry too much - different things 0  Trouble relaxing 0  Restless 0  Easily annoyed or irritable 0  Afraid - awful might happen 0  Total GAD 7 Score 0  Anxiety Difficulty Not difficult at all   Most Recent Fall Screen:    01/20/2024    8:57 AM 09/19/2021    1:35 PM 10/16/2019    9:09 AM 09/07/2019    1:46 PM  Fall Risk   Falls in the past year?  0 0  0   Number falls in past yr: 0 0 0 0  Injury with Fall? 0  0 0  Risk for fall due to : No Fall Risks History of fall(s)    Follow up Falls evaluation completed Falls evaluation completed        Data saved with a previous flowsheet row definition    Past medical history, surgical history, medications, allergies, family history and  social history reviewed with patient today and changes made to appropriate areas of the chart.  Past Medical History:  Past Medical History:  Diagnosis Date   Depression    Depression, major, single episode, moderate (HCC) 01/25/2017   Hypertension    Medications:  Current Outpatient Medications on File Prior to Visit  Medication Sig   Ascorbic Acid (VITAMIN C ER PO) Take by mouth.   BLACK CURRANT SEED OIL PO Take by mouth.   Cholecalciferol (VITAMIN D3) 1.25 MG (50000 UT) TABS Take 1 tablet by mouth once a week.   COD LIVER OIL PO Take by mouth.   CRANBERRY PO Take by mouth.   cyclobenzaprine  (FLEXERIL ) 5 MG tablet Take 1 tablet (5 mg total) by mouth at bedtime as needed for muscle spasms.   ibuprofen  (ADVIL ) 800 MG tablet Take 1 tablet (800 mg total) by mouth 3 (three) times daily.   Multiple Vitamin (MULTIVITAMIN) tablet Take 1 tablet by mouth daily.   No current facility-administered medications on file prior to visit.   Surgical History:  Past Surgical History:   Procedure Laterality Date   APPENDECTOMY     CESAREAN SECTION     TUBAL LIGATION     Allergies:  No Known Allergies Family History:  Family History  Problem Relation Age of Onset   Heart disease Mother 58   Hypertension Father    Hyperlipidemia Father    Stroke Father    Breast cancer Neg Hx        Objective:    BP 136/88   Pulse 94   Ht 5' 1 (1.549 m)   Wt 136 lb 3.2 oz (61.8 kg)   LMP 01/03/2024   SpO2 98%   BMI 25.73 kg/m   Wt Readings from Last 3 Encounters:  01/20/24 136 lb 3.2 oz (61.8 kg)  03/26/23 132 lb 9.6 oz (60.1 kg)  12/07/22 139 lb 6.4 oz (63.2 kg)    Physical Exam Vitals and nursing note reviewed.  Constitutional:      General: She is not in acute distress.    Appearance: Normal appearance.  HENT:     Head: Normocephalic and atraumatic.     Right Ear: Hearing, tympanic membrane, ear canal and external ear normal.     Left Ear: Hearing, tympanic membrane, ear canal and external ear normal.     Nose: Nose normal.     Right Sinus: No maxillary sinus tenderness or frontal sinus tenderness.     Left Sinus: No maxillary sinus tenderness or frontal sinus tenderness.     Mouth/Throat:     Lips: Pink.     Mouth: Mucous membranes are moist.     Pharynx: Oropharynx is clear.   Eyes:     General: Lids are normal. Vision grossly intact.     Extraocular Movements: Extraocular movements intact.     Conjunctiva/sclera: Conjunctivae normal.     Pupils: Pupils are equal, round, and reactive to light.     Funduscopic exam:    Right eye: Red reflex present.        Left eye: Red reflex present.    Visual Fields: Right eye visual fields normal and left eye visual fields normal.   Neck:     Thyroid: No thyromegaly.     Vascular: No carotid bruit.   Cardiovascular:     Rate and Rhythm: Normal rate and regular rhythm.     Chest Wall: PMI is not displaced.     Pulses: Normal pulses.  Dorsalis pedis pulses are 2+ on the right side and 2+ on the left  side.       Posterior tibial pulses are 2+ on the right side and 2+ on the left side.     Heart sounds: Murmur heard.  Pulmonary:     Effort: Pulmonary effort is normal. No respiratory distress.     Breath sounds: Normal breath sounds.  Abdominal:     General: Abdomen is flat. Bowel sounds are normal. There is no distension.     Palpations: Abdomen is soft. There is no hepatomegaly, splenomegaly or mass.     Tenderness: There is no abdominal tenderness. There is no right CVA tenderness, left CVA tenderness, guarding or rebound.   Musculoskeletal:        General: Normal range of motion.     Cervical back: Full passive range of motion without pain, normal range of motion and neck supple. No tenderness.     Right lower leg: No edema.     Left lower leg: No edema.  Feet:     Left foot:     Toenail Condition: Left toenails are normal.  Lymphadenopathy:     Cervical: No cervical adenopathy.     Upper Body:     Right upper body: No supraclavicular adenopathy.     Left upper body: No supraclavicular adenopathy.   Skin:    General: Skin is warm and dry.     Capillary Refill: Capillary refill takes less than 2 seconds.     Nails: There is no clubbing.   Neurological:     General: No focal deficit present.     Mental Status: She is alert and oriented to person, place, and time.     GCS: GCS eye subscore is 4. GCS verbal subscore is 5. GCS motor subscore is 6.     Sensory: Sensation is intact.     Motor: Motor function is intact.     Coordination: Coordination is intact.     Gait: Gait is intact.     Deep Tendon Reflexes: Reflexes are normal and symmetric.   Psychiatric:        Attention and Perception: Attention normal.        Mood and Affect: Mood normal.        Speech: Speech normal.        Behavior: Behavior normal. Behavior is cooperative.        Thought Content: Thought content normal.        Cognition and Memory: Cognition and memory normal.        Judgment: Judgment normal.      Results for orders placed or performed in visit on 12/07/22  Hemoglobin A1c   Collection Time: 12/07/22  3:15 PM  Result Value Ref Range   Hgb A1c MFr Bld 5.8 (H) 4.8 - 5.6 %   Est. average glucose Bld gHb Est-mCnc 120 mg/dL  CBC with Differential/Platelet   Collection Time: 12/07/22  3:15 PM  Result Value Ref Range   WBC 6.5 3.4 - 10.8 x10E3/uL   RBC 4.48 3.77 - 5.28 x10E6/uL   Hemoglobin 13.2 11.1 - 15.9 g/dL   Hematocrit 11.9 14.7 - 46.6 %   MCV 88 79 - 97 fL   MCH 29.5 26.6 - 33.0 pg   MCHC 33.3 31.5 - 35.7 g/dL   RDW 82.9 56.2 - 13.0 %   Platelets 398 150 - 450 x10E3/uL   Neutrophils 59 Not Estab. %   Lymphs 33 Not Estab. %  Monocytes 7 Not Estab. %   Eos 0 Not Estab. %   Basos 1 Not Estab. %   Neutrophils Absolute 3.9 1.4 - 7.0 x10E3/uL   Lymphocytes Absolute 2.2 0.7 - 3.1 x10E3/uL   Monocytes Absolute 0.4 0.1 - 0.9 x10E3/uL   EOS (ABSOLUTE) 0.0 0.0 - 0.4 x10E3/uL   Basophils Absolute 0.0 0.0 - 0.2 x10E3/uL   Immature Granulocytes 0 Not Estab. %   Immature Grans (Abs) 0.0 0.0 - 0.1 x10E3/uL  Comprehensive metabolic panel   Collection Time: 12/07/22  3:15 PM  Result Value Ref Range   Glucose 82 70 - 99 mg/dL   BUN 6 6 - 24 mg/dL   Creatinine, Ser 1.61 0.57 - 1.00 mg/dL   eGFR 096 >04 VW/UJW/1.19   BUN/Creatinine Ratio 9 9 - 23   Sodium 140 134 - 144 mmol/L   Potassium 4.1 3.5 - 5.2 mmol/L   Chloride 104 96 - 106 mmol/L   CO2 17 (L) 20 - 29 mmol/L   Calcium 9.8 8.7 - 10.2 mg/dL   Total Protein 7.3 6.0 - 8.5 g/dL   Albumin 4.6 3.9 - 4.9 g/dL   Globulin, Total 2.7 1.5 - 4.5 g/dL   Albumin/Globulin Ratio 1.7 1.2 - 2.2   Bilirubin Total 0.3 0.0 - 1.2 mg/dL   Alkaline Phosphatase 85 44 - 121 IU/L   AST 15 0 - 40 IU/L   ALT 16 0 - 32 IU/L  VITAMIN D  25 Hydroxy (Vit-D Deficiency, Fractures)   Collection Time: 12/07/22  3:15 PM  Result Value Ref Range   Vit D, 25-Hydroxy 16.6 (L) 30.0 - 100.0 ng/mL       Assessment & Plan:   Problem List Items  Addressed This Visit     Essential hypertension, benign   Blood pressure was slightly elevated at the office visit, but home readings are typically around 132/82 mmHg. She experienced a morning headache that resolved after taking her antihypertensive medication. Possible white coat hypertension is suspected due to anxiety in the office setting. - Monitor blood pressure at home over the next few weeks and report readings. - Refill amlodipine  prescription, which is due to run out in August.      Relevant Medications   amLODipine  (NORVASC ) 10 MG tablet   Other Relevant Orders   CBC with Differential/Platelet   CMP14+EGFR   Hemoglobin A1c   Lipid panel   Encounter for annual physical exam - Primary   CPE completed today. Review of HM activities and recommendations discussed and provided on AVS. Anticipatory guidance, diet, and exercise recommendations provided. Medications, allergies, and hx reviewed and updated as necessary. Orders placed as listed below.  Plan: - Labs ordered. Will make changes as necessary based on results.  - I will review these results and send recommendations via MyChart or a telephone call.  - F/U with CPE in 1 year or sooner for acute/chronic health needs as directed.        Vitamin D  deficiency   Last levels very low. Previously taking high dose vitamin D  once weekly. Will recheck today.       Relevant Orders   CMP14+EGFR   VITAMIN D  25 Hydroxy (Vit-D Deficiency, Fractures)   Prediabetes   Elevated blood glucose levels with a recent A1c of 5.6%. She is motivated to improve her diet and make lifestyle changes to prevent progression to diabetes. Educational materials are provided to support dietary changes. - Provide educational materials on high protein, low carb snack options and a sample  meal plan for diabetes management. - Order labs including A1c to monitor blood glucose levels.      Relevant Medications   amLODipine  (NORVASC ) 10 MG tablet   Other  Relevant Orders   CBC with Differential/Platelet   CMP14+EGFR   Hemoglobin A1c   Lipid panel   Perimenopause   Experiencing hot flashes, common in perimenopause, which may precede menstrual cycle changes and last for years before menopause is complete. Informed that her experience may be similar to her mother's.      Routine screening for STI (sexually transmitted infection)   Requests STI testing despite being asymptomatic. Tests will be performed as part of routine health maintenance. - Order STI screening tests.      Relevant Orders   Ct, Ng, Mycoplasmas NAA, Urine   RPR   HIV Antibody (routine testing w rflx)   Hepatitis C antibody   Other Visit Diagnoses       Need for HPV vaccine       Relevant Orders   HPV 9-valent vaccine,Recombinat         Follow up plan: Return in about 6 months (around 07/21/2024) for Med Management 30.  NEXT PREVENTATIVE PHYSICAL DUE IN 1 YEAR.  PATIENT COUNSELING PROVIDED FOR ALL ADULT PATIENTS: A well balanced diet low in saturated fats, cholesterol, and moderation in carbohydrates.  This can be as simple as monitoring portion sizes and cutting back on sugary beverages such as soda and juice to start with.    Daily water consumption of at least 64 ounces.  Physical activity at least 180 minutes per week.  If just starting out, start 10 minutes a day and work your way up.   This can be as simple as taking the stairs instead of the elevator and walking 2-3 laps around the office  purposefully every day.   STD protection, partner selection, and regular testing if high risk.  Limited consumption of alcoholic beverages if alcohol is consumed. For men, I recommend no more than 14 alcoholic beverages per week, spread out throughout the week (max 2 per day). Avoid binge drinking or consuming large quantities of alcohol in one setting.  Please let me know if you feel you may need help with reduction or quitting alcohol consumption.    Avoidance of nicotine , if used. Please let me know if you feel you may need help with reduction or quitting nicotine  use.   Daily mental health attention. This can be in the form of 5 minute daily meditation, prayer, journaling, yoga, reflection, etc.  Purposeful attention to your emotions and mental state can significantly improve your overall wellbeing  and  Health.  Please know that I am here to help you with all of your health care goals and am happy to work with you to find a solution that works best for you.  The greatest advice I have received with any changes in life are to take it one step at a time, that even means if all you can focus on is the next 60 seconds, then do that and celebrate your victories.  With any changes in life, you will have set backs, and that is OK. The important thing to remember is, if you have a set back, it is not a failure, it is an opportunity to try again! Screening Testing Mammogram Every 1 -2 years based on history and risk factors Starting at age 40 Pap Smear Ages 21-39 every 3 years Ages 7-65 every 5 years with HPV testing  More frequent testing may be required based on results and history Colon Cancer Screening Every 1-10 years based on test performed, risk factors, and history Starting at age 47 Bone Density Screening Every 2-10 years based on history Starting at age 51 for women Recommendations for men differ based on medication usage, history, and risk factors AAA Screening One time ultrasound Men 24-59 years old who have every smoked Lung Cancer Screening Low Dose Lung CT every 12 months Age 94-80 years with a 30 pack-year smoking history who still smoke or who have quit within the last 15 years   Screening Labs Routine  Labs: Complete Blood Count (CBC), Complete Metabolic Panel (CMP), Cholesterol (Lipid Panel) Every 6-12 months based on history and medications May be recommended more frequently based on current conditions or  previous results Hemoglobin A1c Lab Every 3-12 months based on history and previous results Starting at age 41 or earlier with diagnosis of diabetes, high cholesterol, BMI >26, and/or risk factors Frequent monitoring for patients with diabetes to ensure blood sugar control Thyroid Panel (TSH) Every 6 months based on history, symptoms, and risk factors May be repeated more often if on medication HIV One time testing for all patients 81 and older May be repeated more frequently for patients with increased risk factors or exposure Hepatitis C One time testing for all patients 2 and older May be repeated more frequently for patients with increased risk factors or exposure Gonorrhea, Chlamydia Every 12 months for all sexually active persons 13-24 years Additional monitoring may be recommended for those who are considered high risk or who have symptoms Every 12 months for any woman on birth control, regardless of sexual activity PSA Men 54-90 years old with risk factors Additional screening may be recommended from age 51-69 based on risk factors, symptoms, and history  Vaccine Recommendations Tetanus Booster All adults every 10 years Flu Vaccine All patients 6 months and older every year COVID Vaccine All patients 12 years and older Initial dosing with booster May recommend additional booster based on age and health history HPV Vaccine 2 doses all patients age 12-26 Dosing may be considered for patients over 26 Shingles Vaccine (Shingrix) 2 doses all adults 55 years and older Pneumonia (Pneumovax 79) All adults 65 years and older May recommend earlier dosing based on health history One year apart from Prevnar 22 Pneumonia (Prevnar 51) All adults 65 years and older Dosed 1 year after Pneumovax 23 Pneumonia (Prevnar 20) One time alternative to the two dosing of 13 and 23 For all adults with initial dose of 23, 20 is recommended 1 year later For all adults with initial dose of  13, 23 is still recommended as second option 1 year later

## 2024-01-20 NOTE — Assessment & Plan Note (Signed)

## 2024-01-21 LAB — CBC WITH DIFFERENTIAL/PLATELET
Basophils Absolute: 0 10*3/uL (ref 0.0–0.2)
Basos: 1 %
EOS (ABSOLUTE): 0.1 10*3/uL (ref 0.0–0.4)
Eos: 1 %
Hematocrit: 41.3 % (ref 34.0–46.6)
Hemoglobin: 13.2 g/dL (ref 11.1–15.9)
Immature Grans (Abs): 0 10*3/uL (ref 0.0–0.1)
Immature Granulocytes: 0 %
Lymphocytes Absolute: 1.6 10*3/uL (ref 0.7–3.1)
Lymphs: 26 %
MCH: 29.1 pg (ref 26.6–33.0)
MCHC: 32 g/dL (ref 31.5–35.7)
MCV: 91 fL (ref 79–97)
Monocytes Absolute: 0.4 10*3/uL (ref 0.1–0.9)
Monocytes: 6 %
Neutrophils Absolute: 4.1 10*3/uL (ref 1.4–7.0)
Neutrophils: 66 %
Platelets: 422 10*3/uL (ref 150–450)
RBC: 4.53 x10E6/uL (ref 3.77–5.28)
RDW: 12.7 % (ref 11.7–15.4)
WBC: 6.3 10*3/uL (ref 3.4–10.8)

## 2024-01-21 LAB — VITAMIN D 25 HYDROXY (VIT D DEFICIENCY, FRACTURES): Vit D, 25-Hydroxy: 14.6 ng/mL — ABNORMAL LOW (ref 30.0–100.0)

## 2024-01-21 LAB — CMP14+EGFR
ALT: 11 IU/L (ref 0–32)
AST: 16 IU/L (ref 0–40)
Albumin: 4.5 g/dL (ref 3.9–4.9)
Alkaline Phosphatase: 85 IU/L (ref 44–121)
BUN/Creatinine Ratio: 18 (ref 9–23)
BUN: 13 mg/dL (ref 6–24)
Bilirubin Total: <0.2 mg/dL (ref 0.0–1.2)
CO2: 19 mmol/L — ABNORMAL LOW (ref 20–29)
Calcium: 9.2 mg/dL (ref 8.7–10.2)
Chloride: 104 mmol/L (ref 96–106)
Creatinine, Ser: 0.74 mg/dL (ref 0.57–1.00)
Globulin, Total: 2.6 g/dL (ref 1.5–4.5)
Glucose: 94 mg/dL (ref 70–99)
Potassium: 4.4 mmol/L (ref 3.5–5.2)
Sodium: 139 mmol/L (ref 134–144)
Total Protein: 7.1 g/dL (ref 6.0–8.5)
eGFR: 102 mL/min/{1.73_m2} (ref >59)

## 2024-01-21 LAB — LIPID PANEL
Chol/HDL Ratio: 2.5 ratio (ref 0.0–4.4)
Cholesterol, Total: 160 mg/dL (ref 100–199)
HDL: 65 mg/dL (ref >39)
LDL Chol Calc (NIH): 84 mg/dL (ref 0–99)
Triglycerides: 50 mg/dL (ref 0–149)
VLDL Cholesterol Cal: 11 mg/dL (ref 5–40)

## 2024-01-21 LAB — RPR: RPR Ser Ql: NONREACTIVE

## 2024-01-21 LAB — HEPATITIS C ANTIBODY: Hep C Virus Ab: NONREACTIVE

## 2024-01-21 LAB — HEMOGLOBIN A1C
Est. average glucose Bld gHb Est-mCnc: 114 mg/dL
Hgb A1c MFr Bld: 5.6 % (ref 4.8–5.6)

## 2024-01-21 LAB — HIV ANTIBODY (ROUTINE TESTING W REFLEX): HIV Screen 4th Generation wRfx: NONREACTIVE

## 2024-01-22 LAB — CT, NG, MYCOPLASMAS NAA, URINE
Chlamydia trachomatis, NAA: NEGATIVE
Neisseria gonorrhoeae, NAA: NEGATIVE

## 2024-01-28 ENCOUNTER — Ambulatory Visit: Payer: Self-pay | Admitting: Nurse Practitioner

## 2024-01-28 ENCOUNTER — Telehealth: Payer: Self-pay

## 2024-01-28 DIAGNOSIS — E559 Vitamin D deficiency, unspecified: Secondary | ICD-10-CM

## 2024-01-28 MED ORDER — VITAMIN D (ERGOCALCIFEROL) 1.25 MG (50000 UNIT) PO CAPS: 50000.0000 [IU] | ORAL_CAPSULE | ORAL | 3 refills | Status: AC

## 2024-01-28 NOTE — Telephone Encounter (Signed)
 Called Pt left detailed message to check Mychart and go to test results to see results.   Copied from CRM [id number]. Topic: Clinical - Lab/Test Results >> Jan 28, 2024  1:42 PM Gustabo D wrote: Patient would like to view her results on Mychart for the STD and HIV test that was done. I gave her the message she said she seen that but would like to see the actual results for it on her mychart

## 2024-02-26 ENCOUNTER — Other Ambulatory Visit

## 2024-02-26 DIAGNOSIS — Z23 Encounter for immunization: Secondary | ICD-10-CM

## 2024-07-08 ENCOUNTER — Encounter: Payer: Self-pay | Admitting: Family Medicine

## 2024-07-08 ENCOUNTER — Ambulatory Visit: Admitting: Family Medicine

## 2024-07-08 VITALS — BP 112/72 | HR 86 | Wt 141.2 lb

## 2024-07-08 DIAGNOSIS — J069 Acute upper respiratory infection, unspecified: Secondary | ICD-10-CM

## 2024-07-08 DIAGNOSIS — J029 Acute pharyngitis, unspecified: Secondary | ICD-10-CM | POA: Diagnosis not present

## 2024-07-08 DIAGNOSIS — R059 Cough, unspecified: Secondary | ICD-10-CM | POA: Diagnosis not present

## 2024-07-08 LAB — POCT INFLUENZA A/B
Influenza A, POC: NEGATIVE
Influenza B, POC: NEGATIVE

## 2024-07-08 LAB — POC COVID19 BINAXNOW: SARS Coronavirus 2 Ag: NEGATIVE

## 2024-07-08 MED ORDER — FLUTICASONE PROPIONATE 50 MCG/ACT NA SUSP: 2.0000 | Freq: Every day | NASAL | 6 refills | Status: AC

## 2024-07-08 MED ORDER — METHYLPREDNISOLONE 4 MG PO TBPK: ORAL_TABLET | ORAL | 0 refills | Status: AC

## 2024-07-08 NOTE — Progress Notes (Signed)
   Name: Heidi Molina   Date of Visit: 07/08/24   Date of last visit with me: Visit date not found   CHIEF COMPLAINT:  Chief Complaint  Patient presents with   Acute Visit    Cold/ covid symptoms. ST, head ache, pain and pressure on head, dizziness, no fever, runny nose, coughing at night, stomach hurts from coughing, started 3 days ago,        HPI:  Discussed the use of AI scribe software for clinical note transcription with the patient, who gave verbal consent to proceed.  History of Present Illness   Heidi Molina is a 43 year old female who presents with sore throat, lightheadedness, and upper respiratory symptoms.  She has been experiencing a sore throat, lightheadedness, and a sensation of pressure on her head for the past three days. Additionally, she reports increased coughing at night, nasal congestion, runny nose, and body aches. Initial tests for COVID-19 and flu were reported as negative.  She is currently taking amlodipine  daily for hypertension.         OBJECTIVE:       01/20/2024    8:57 AM  Depression screen PHQ 2/9  Decreased Interest 0  Down, Depressed, Hopeless 0  PHQ - 2 Score 0     BP Readings from Last 3 Encounters:  07/08/24 112/72  01/20/24 136/88  03/26/23 (!) 132/98    BP 112/72   Pulse 86   Wt 141 lb 3.2 oz (64 kg)   SpO2 97%   BMI 26.68 kg/m    Physical Exam          Physical Exam Constitutional:      Appearance: Normal appearance. She is normal weight.  HENT:     Nose: Congestion and rhinorrhea present.     Mouth/Throat:     Pharynx: Posterior oropharyngeal erythema present. No oropharyngeal exudate.  Neurological:     General: No focal deficit present.     Mental Status: She is alert and oriented to person, place, and time. Mental status is at baseline.     ASSESSMENT/PLAN:   Assessment & Plan Cough, unspecified type  Sore throat  Viral URI    Assessment and Plan    Acute upper respiratory  infection Symptoms consistent with viral etiology. Negative for influenza and COVID-19. - Prescribed steroid for symptom relief. - Prescribed Flonase for congestion. - Advised humidifier use at night. - Encouraged hydration with electrolyte fluids. - Provided work excuse for Friday, return Monday.  Hypertension- chronic and stable Blood pressure well-controlled. - Continue daily amlodipine . Will recommend staying hydrated with Gatorades and pedialyes to keep bp up as pressures are a touch soft now.        Joash Tony A. Vita MD [facility]

## 2024-07-22 ENCOUNTER — Encounter: Payer: Self-pay | Admitting: Nurse Practitioner

## 2024-07-28 ENCOUNTER — Other Ambulatory Visit: Payer: Self-pay

## 2024-07-28 DIAGNOSIS — Z23 Encounter for immunization: Secondary | ICD-10-CM | POA: Diagnosis not present

## 2024-07-28 NOTE — Progress Notes (Unsigned)
 Patient is in office today for a nurse visit for Birth Control Injection. Patient Injection was given in the  Right deltoid. Patient tolerated injection well.

## 2024-09-09 NOTE — Progress Notes (Unsigned)
 Mammogram {SEHMDOC:34218}, Pap Smear {SEHMDOC:34218}, Colon Cancer Screening {SEHMDOC:34218}, and Influenza Vaccine {SEHMDOC:34218}  Catheline Doing, DNP, AGNP-c [facility]  [address] [place], [place] [address] [phone]   ESTABLISHED PATIENT- Chronic Health and/or Follow-Up Visit on 09/14/2024  There were no vitals taken for this visit.   Subjective:  No chief complaint on file.   *** ROS negative except for what is listed in HPI. History, Medications, Surgery, SDOH, and Family History reviewed and updated as appropriate.  Objective:  Physical Exam      Assessment & Plan:   Assessment & Plan    Camie FORBES Doing, DNP, AGNP-c  {SETIMEYorN (Optional):34216}

## 2024-09-14 ENCOUNTER — Ambulatory Visit: Admitting: Nurse Practitioner

## 2025-01-20 ENCOUNTER — Encounter: Payer: Self-pay | Admitting: Nurse Practitioner
